# Patient Record
Sex: Female | Born: 1960 | Race: White | Hispanic: No | Marital: Married | Smoking: Never smoker
Health system: Southern US, Community
[De-identification: ages and names within clinical notes are randomized; demographics above are authoritative.]

## PROBLEM LIST (undated history)

## (undated) DIAGNOSIS — D649 Anemia, unspecified: Secondary | ICD-10-CM

## (undated) DIAGNOSIS — R51 Headache: Secondary | ICD-10-CM

## (undated) DIAGNOSIS — I10 Essential (primary) hypertension: Secondary | ICD-10-CM

## (undated) DIAGNOSIS — R000 Tachycardia, unspecified: Secondary | ICD-10-CM

## (undated) DIAGNOSIS — I498 Other specified cardiac arrhythmias: Secondary | ICD-10-CM

## (undated) DIAGNOSIS — R42 Dizziness and giddiness: Secondary | ICD-10-CM

## (undated) HISTORY — DX: Dizziness and giddiness: R42

## (undated) HISTORY — PX: NOSE SURGERY: SHX723

## (undated) HISTORY — PX: ENDOMETRIAL ABLATION: SHX621

## (undated) HISTORY — DX: Tachycardia, unspecified: R00.0

## (undated) HISTORY — PX: TONSILLECTOMY: SUR1361

## (undated) HISTORY — DX: Other specified cardiac arrhythmias: I49.8

## (undated) HISTORY — PX: APPENDECTOMY: SHX54

---

## 1998-12-19 ENCOUNTER — Ambulatory Visit (HOSPITAL_BASED_OUTPATIENT_CLINIC_OR_DEPARTMENT_OTHER): Admission: RE | Admit: 1998-12-19 | Discharge: 1998-12-19 | Payer: Self-pay | Admitting: Specialist

## 2000-07-24 ENCOUNTER — Encounter: Admission: RE | Admit: 2000-07-24 | Discharge: 2000-07-24 | Payer: Self-pay | Admitting: Specialist

## 2000-07-24 ENCOUNTER — Encounter: Payer: Self-pay | Admitting: Specialist

## 2000-09-22 ENCOUNTER — Emergency Department (HOSPITAL_COMMUNITY): Admission: EM | Admit: 2000-09-22 | Discharge: 2000-09-22 | Payer: Self-pay

## 2003-07-15 ENCOUNTER — Inpatient Hospital Stay (HOSPITAL_COMMUNITY): Admission: EM | Admit: 2003-07-15 | Discharge: 2003-07-17 | Payer: Self-pay | Admitting: *Deleted

## 2004-01-21 ENCOUNTER — Emergency Department (HOSPITAL_COMMUNITY): Admission: EM | Admit: 2004-01-21 | Discharge: 2004-01-21 | Payer: Self-pay | Admitting: Emergency Medicine

## 2011-02-27 ENCOUNTER — Other Ambulatory Visit: Payer: Self-pay | Admitting: Obstetrics and Gynecology

## 2011-03-02 MED ORDER — LACTATED RINGERS IV SOLN: INTRAVENOUS | Status: DC

## 2011-03-05 ENCOUNTER — Encounter (HOSPITAL_COMMUNITY)
Admission: RE | Admit: 2011-03-05 | Discharge: 2011-03-05 | Disposition: A | Discharge status: Home / Self Care | Payer: BC Managed Care – PPO | Source: Ambulatory Visit | Attending: Obstetrics and Gynecology | Admitting: Obstetrics and Gynecology

## 2011-03-05 ENCOUNTER — Encounter (HOSPITAL_COMMUNITY): Payer: Self-pay

## 2011-03-05 HISTORY — DX: Essential (primary) hypertension: I10

## 2011-03-05 HISTORY — DX: Headache: R51

## 2011-03-05 HISTORY — DX: Anemia, unspecified: D64.9

## 2011-03-05 LAB — CBC
HCT: 36.7 % (ref 36.0–46.0)
Hemoglobin: 11.8 g/dL — ABNORMAL LOW (ref 12.0–15.0)
MCV: 88.9 fL (ref 78.0–100.0)
RBC: 4.13 MIL/uL (ref 3.87–5.11)
WBC: 7.4 10*3/uL (ref 4.0–10.5)

## 2011-03-05 LAB — BASIC METABOLIC PANEL
CO2: 27 mEq/L (ref 19–32)
Chloride: 99 mEq/L (ref 96–112)
Creatinine, Ser: 0.65 mg/dL (ref 0.50–1.10)
GFR calc Af Amer: >60 mL/min (ref >60)
Potassium: 3.8 mEq/L (ref 3.5–5.1)

## 2011-03-05 NOTE — Anesthesia Preprocedure Evaluation (Addendum)
Anesthesia Evaluation  Name, MR# and DOB Patient awake  General Assessment Comment  Reviewed: Allergy & Precautions, H&P  and Patient's Chart, lab work & pertinent test results  Airway Mallampati: II TM Distance: >3 FB Neck ROM: Full    Dental No notable dental hx (+) Teeth Intact   Pulmonaryneg pulmonary ROS    clear to auscultation  pulmonary exam normal   Cardiovascular hypertension, Pt. on medications Regular Normal   Neuro/Psych (+) {AN ROS/MED HX NEURO HEADACHES (+) Anxiety, Negative Psych ROS  GI/Hepatic/Renal negative GI ROS, negative Liver ROS, and negative Renal ROS (+)       Endo/Other  Negative Endocrine ROS (+)   Abdominal   Musculoskeletal negative musculoskeletal ROS (+)  Hematology negative hematology ROS (+)   Peds  Reproductive/Obstetrics negative OB ROS   Anesthesia Other Findings             Anesthesia Physical Anesthesia Plan  ASA: II  Anesthesia Plan: General   Post-op Pain Management:    Induction: Intravenous  Airway Management Planned: LMA  Additional Equipment:   Intra-op Plan:   Post-operative Plan:   Informed Consent: I have reviewed the patients History and Physical, chart, labs and discussed the procedure including the risks, benefits and alternatives for the proposed anesthesia with the patient or authorized representative who has indicated his/her understanding and acceptance.     Plan Discussed with: Anesthesiologist (AP)  Anesthesia Plan Comments:         Anesthesia Quick Evaluation

## 2011-03-05 NOTE — Patient Instructions (Signed)
20 Kristen Mann  03/05/2011   Your procedure is scheduled on:  03/08/11   Report to [facility] at 0600 AM. Main entrance- desk phone x [phone]  Call this number if you have problems the morning of surgery: [phone]   Remember:   Do not eat food:After Midnight.  Do not drink clear liquids: After Midnight.  Take these medicines the morning of surgery with A SIP OF WATER: BP pill      Do not wear jewelry, make-up or nail polish.  Do not bring valuables to the hospital.  Contacts, dentures or bridgework may not be worn into surgery.  Leave suitcase in the car. After surgery it may be brought to your room.  For patients admitted to the hospital, checkout time is 11:00 AM the day of discharge.   Patients discharged the day of surgery will not be allowed to drive home.  Name and phone number of your driver: Britta Mccreedy [phone]  Special Instructions: N/A   Please read over the following fact sheets that you were given:

## 2011-03-07 NOTE — H&P (Addendum)
Kristen Mann is an 50 y.o. female. Who is coming in for a scheduled hysteroscopy novasure ablation given ongoing issues with menorrhagia and Abnormal bleeding.  SHe has attempted therapy with aygestin with some relief, but still has daily spotting and does not wish to take hormones long term. Endometrial biopsy was negative prior to surgery.  Pertinent Gynecological History: Menses: irregular occurring approximately every 30 days with spotting approximately 14 days per month and cycles lasting up to 14 days Bleeding: intermenstrual bleeding Contraception: vasectomy  H/o ASCUS pap/HPV +  2011--resolved Last pap: normal Date: 02/2011   Menstrual History:  No LMP recorded.    Past Medical History  Diagnosis Date  . Hypertension   . Anemia   . Headache   . Anxiety     Past Surgical History  Procedure Date  . Appendectomy     2003  . Nose surgery     No family history on file.  Social History:  reports that she has never smoked. She does not have any smokeless tobacco history on file. She reports that she drinks alcohol. Her drug history not on file.   She has a boyfriend of 5 years who has had a vasectomy.  Allergies:  Allergies  Allergen Reactions  . Codeine     Patient states that the medication makes her act crazy.  Very emotional.  . Sulfa Antibiotics Itching    No prescriptions prior to admission    Review of Systems  Constitutional: Positive for malaise/fatigue.    There were no vitals taken for this visit. Physical Exam  Constitutional: She appears well-developed and well-nourished.  Cardiovascular: Normal rate and regular rhythm.   Respiratory: Effort normal and breath sounds normal.  GI: Soft. Bowel sounds are normal.  Genitourinary: Vagina normal and uterus normal.  Neurological: She is alert.  Psychiatric: [name]. Her behavior is normal.      Assessment/Plan: Pt is for a scheduled endometrial ablation/hysteroscopy.   Risks and benefits reviewed with pt in detail including risk of bleeding or uterine perforation. Pt understands and desires to proceed.  Will use cytotec prior to surgery to assist with dilation.  Oliver Pila 03/07/2011, 6:45 PM  Per pt, no changes in dictated H&P.

## 2011-03-08 ENCOUNTER — Encounter (HOSPITAL_COMMUNITY): Payer: Self-pay | Admitting: Anesthesiology

## 2011-03-08 ENCOUNTER — Ambulatory Visit (HOSPITAL_COMMUNITY): Payer: BC Managed Care – PPO | Admitting: Anesthesiology

## 2011-03-08 ENCOUNTER — Ambulatory Visit (HOSPITAL_COMMUNITY)
Admission: RE | Admit: 2011-03-08 | Discharge: 2011-03-08 | Disposition: A | Discharge status: Home / Self Care | Payer: BC Managed Care – PPO | Source: Ambulatory Visit | Attending: Obstetrics and Gynecology | Admitting: Obstetrics and Gynecology

## 2011-03-08 ENCOUNTER — Encounter (HOSPITAL_COMMUNITY)
Admission: RE | Disposition: A | Discharge status: Home / Self Care | Payer: Self-pay | Source: Ambulatory Visit | Attending: Obstetrics and Gynecology

## 2011-03-08 DIAGNOSIS — Z01818 Encounter for other preprocedural examination: Secondary | ICD-10-CM | POA: Insufficient documentation

## 2011-03-08 DIAGNOSIS — N920 Excessive and frequent menstruation with regular cycle: Secondary | ICD-10-CM | POA: Insufficient documentation

## 2011-03-08 DIAGNOSIS — Z01812 Encounter for preprocedural laboratory examination: Secondary | ICD-10-CM | POA: Insufficient documentation

## 2011-03-08 SURGERY — HYSTEROSCOPY WITH NOVASURE
Anesthesia: General | Site: Uterus | Wound class: Clean Contaminated

## 2011-03-08 MED ORDER — LIDOCAINE HCL 2 % IJ SOLN
INTRAMUSCULAR | Status: DC | PRN
  Administered 2011-03-08: 20 mL

## 2011-03-08 MED ORDER — MIDAZOLAM HCL 5 MG/5ML IJ SOLN
INTRAMUSCULAR | Status: DC | PRN
  Administered 2011-03-08: 2 mg via INTRAVENOUS

## 2011-03-08 MED ORDER — LIDOCAINE HCL (CARDIAC) 20 MG/ML IV SOLN
INTRAVENOUS | Status: AC
  Filled 2011-03-08: qty 5

## 2011-03-08 MED ORDER — LACTATED RINGERS IR SOLN
Status: DC | PRN
  Administered 2011-03-08: 3000 mL

## 2011-03-08 MED ORDER — HYDROCODONE-ACETAMINOPHEN 5-325 MG PO TABS
2.0000 | ORAL_TABLET | Freq: Once | ORAL | Status: AC
  Administered 2011-03-08: 2 via ORAL

## 2011-03-08 MED ORDER — FENTANYL CITRATE 0.05 MG/ML IJ SOLN
25.0000 ug | INTRAMUSCULAR | Status: DC | PRN
  Administered 2011-03-08 (×2): 50 ug via INTRAVENOUS

## 2011-03-08 MED ORDER — LIDOCAINE HCL (CARDIAC) 20 MG/ML IV SOLN
INTRAVENOUS | Status: DC | PRN
  Administered 2011-03-08: 60 mg via INTRAVENOUS

## 2011-03-08 MED ORDER — KETOROLAC TROMETHAMINE 30 MG/ML IJ SOLN
INTRAMUSCULAR | Status: DC | PRN
  Administered 2011-03-08: 30 mg via INTRAVENOUS

## 2011-03-08 MED ORDER — LACTATED RINGERS IV SOLN
INTRAVENOUS | Status: DC
  Administered 2011-03-08 (×3): via INTRAVENOUS

## 2011-03-08 MED ORDER — FENTANYL CITRATE 0.05 MG/ML IJ SOLN
INTRAMUSCULAR | Status: AC
  Filled 2011-03-08: qty 5

## 2011-03-08 MED ORDER — MIDAZOLAM HCL 2 MG/2ML IJ SOLN
INTRAMUSCULAR | Status: AC
  Filled 2011-03-08: qty 2

## 2011-03-08 MED ORDER — ONDANSETRON HCL 4 MG/2ML IJ SOLN
INTRAMUSCULAR | Status: AC
  Filled 2011-03-08: qty 2

## 2011-03-08 MED ORDER — MEPERIDINE HCL 25 MG/ML IJ SOLN
INTRAMUSCULAR | Status: AC
  Administered 2011-03-08: 12.5 mg via INTRAVENOUS
  Filled 2011-03-08: qty 1

## 2011-03-08 MED ORDER — FENTANYL CITRATE 0.05 MG/ML IJ SOLN
INTRAMUSCULAR | Status: AC
  Administered 2011-03-08: 50 ug via INTRAVENOUS
  Filled 2011-03-08: qty 2

## 2011-03-08 MED ORDER — KETOROLAC TROMETHAMINE 30 MG/ML IJ SOLN
INTRAMUSCULAR | Status: AC
  Filled 2011-03-08: qty 1

## 2011-03-08 MED ORDER — PROPOFOL 10 MG/ML IV EMUL
INTRAVENOUS | Status: AC
  Filled 2011-03-08: qty 20

## 2011-03-08 MED ORDER — DEXAMETHASONE SODIUM PHOSPHATE 10 MG/ML IJ SOLN
INTRAMUSCULAR | Status: AC
  Filled 2011-03-08: qty 1

## 2011-03-08 MED ORDER — DEXAMETHASONE SODIUM PHOSPHATE 4 MG/ML IJ SOLN
INTRAMUSCULAR | Status: DC | PRN
  Administered 2011-03-08: 8 mg via INTRAVENOUS

## 2011-03-08 MED ORDER — HYDROCODONE-ACETAMINOPHEN 5-325 MG PO TABS
ORAL_TABLET | ORAL | Status: AC
  Filled 2011-03-08: qty 1

## 2011-03-08 MED ORDER — PROPOFOL 10 MG/ML IV EMUL
INTRAVENOUS | Status: DC | PRN
  Administered 2011-03-08: 180 mg via INTRAVENOUS

## 2011-03-08 MED ORDER — MEPERIDINE HCL 25 MG/ML IJ SOLN
6.2500 mg | INTRAMUSCULAR | Status: DC | PRN
  Administered 2011-03-08: 12.5 mg via INTRAVENOUS

## 2011-03-08 MED ORDER — FENTANYL CITRATE 0.05 MG/ML IJ SOLN
INTRAMUSCULAR | Status: DC | PRN
  Administered 2011-03-08: 100 ug via INTRAVENOUS

## 2011-03-08 MED ORDER — ONDANSETRON HCL 4 MG/2ML IJ SOLN
INTRAMUSCULAR | Status: DC | PRN
  Administered 2011-03-08: 4 mg via INTRAVENOUS

## 2011-03-08 SURGICAL SUPPLY — 17 items
ABLATOR ENDOMETRIAL BIPOLAR (ABLATOR) ×3 IMPLANT
CANISTER SUCTION 2500CC (MISCELLANEOUS) ×3 IMPLANT
CATH ROBINSON RED A/P 16FR (CATHETERS) ×3 IMPLANT
CLOTH BEACON ORANGE TIMEOUT ST (SAFETY) ×3 IMPLANT
CONTAINER PREFILL 10% NBF 60ML (FORM) ×6 IMPLANT
DRAPE BUTTOCK UNDER FLUID (DRAPE) ×3 IMPLANT
DRAPE UTILITY XL STRL (DRAPES) ×3 IMPLANT
ELECT REM PT RETURN 9FT ADLT (ELECTROSURGICAL)
ELECTRODE REM PT RTRN 9FT ADLT (ELECTROSURGICAL) IMPLANT
GLOVE BIO SURGEON STRL SZ8 (GLOVE) ×3 IMPLANT
GLOVE ORTHO TXT STRL SZ7.5 (GLOVE) ×3 IMPLANT
GOWN PREVENTION PLUS LG XLONG (DISPOSABLE) ×3 IMPLANT
GOWN PREVENTION PLUS XLARGE (GOWN DISPOSABLE) ×3 IMPLANT
LOOP ANGLED CUTTING 22FR (CUTTING LOOP) IMPLANT
PACK HYSTEROSCOPY LF (CUSTOM PROCEDURE TRAY) ×3 IMPLANT
SLEEVE SCD COMPRESS KNEE MED (MISCELLANEOUS) ×3 IMPLANT
TOWEL OR 17X24 6PK STRL BLUE (TOWEL DISPOSABLE) ×6 IMPLANT

## 2011-03-08 NOTE — Anesthesia Postprocedure Evaluation (Signed)
  Anesthesia Post-op Note  Patient: Kristen Mann  Procedure(s) Performed:  HYSTEROSCOPY WITH NOVASURE  Patient Location: PACU  Anesthesia Type: General  Level of Consciousness: awake, alert  and oriented  Airway and Oxygen Therapy: Patient Spontanous Breathing  Post-op Pain: none  Post-op Assessment: Post-op Vital signs reviewed, Patient's Cardiovascular Status Stable, Respiratory Function Stable, Patent Airway, No signs of Nausea or vomiting and Pain level controlled  Post-op Vital Signs: Reviewed and stable  Complications: No apparent anesthesia complications

## 2011-03-08 NOTE — Transfer of Care (Signed)
Immediate Anesthesia Transfer of Care Note  Patient: Kristen Mann  Procedure(s) Performed:  HYSTEROSCOPY WITH NOVASURE  Patient Location: PACU  Anesthesia Type: General  Level of Consciousness: awake, alert  and oriented  Airway & Oxygen Therapy: Patient Spontanous Breathing and Patient connected to nasal cannula oxygen  Post-op Assessment: Report given to PACU RN  Post vital signs: Reviewed and stable  Complications: No apparent anesthesia complications

## 2011-03-08 NOTE — Op Note (Addendum)
Operative note    Preop diagnosis    menorrhagia and abnormal uterine bleeding  Postop diagnosis   same with normal uterine cavity  Procedure  hysteroscopy and NovaSure ablation  Surgeon Dr. Huel Cote  Anesthesia LMA and local paracervical block with 2% lidocaine  Findings   the uterine cavity sounded 9 cm and the cervical length was 3.5 cm for a cavity length of 5.5 and a cavity width was 4.5. The total treatment time was 1 minute and 38 seconds at a power of 136. The cavity itself was normal with no polyps or fibroids noted.  Fluids estimated blood loss minimal IV fluids approximately 1 L of lactated Ringer's Urine output approximately 50 cc straight catheter prior to procedure Hysteroscopic deficit with at , however easily 100 was on the floor  Procedure Patient was taken to the operating room after informed consent was obtained. An appropriate time out was performed and the patient was prepped and draped in the normal sterile fashion in the dorsal lithotomy position. A speculum was placed within the vagina and the cervix identified and injected with approximately 2 cc of 2% lidocaine on the anterior lip. This was then grasped with a single-tooth tenaculum and an additional 8 cc each was placed at 2 and 10:00 for a paracervical block. The uterus was then easily sounded to 9 cm and the cervix measured at 3.5 cm with the Hegar dilator. The cervix was then found to be dilated from the Cytotec use and the [facility] dilators easily passed up to size 23. At this point the diagnostic hysteroscope was introduced into the uterus and the cavity inspected and found to be normal. Both tubal ostia were visible and there is no fibroids or polyps noted. The hysteroscope was then removed and the NovaSure device placed within the cavity. It was easily deployed and a cavity width of 4.5 noted. The cervical seal was then placed and a cavity check test performed and passed. The device was then  activated and a treatment time of 1 minute and 38 seconds noted. At the conclusion of treatment the device was removed and the hysteroscope reintroduced into the cavity. The entire cavity appeared well treated it with blanching noted and no viable endometrium seen even in the cornua. At this point the procedure was concluded and the hysteroscope removed. The tenaculum was removed and the tenaculum site treated with silver nitrate for a small area of bleeding. All instruments and sponge counts were correct and the patient was taken to the recovery room in good condition.

## 2011-07-29 ENCOUNTER — Encounter (HOSPITAL_COMMUNITY): Payer: Self-pay | Admitting: *Deleted

## 2011-07-29 ENCOUNTER — Emergency Department (HOSPITAL_COMMUNITY): Payer: BC Managed Care – PPO

## 2011-07-29 ENCOUNTER — Other Ambulatory Visit: Payer: Self-pay

## 2011-07-29 ENCOUNTER — Emergency Department (HOSPITAL_COMMUNITY)
Admission: EM | Admit: 2011-07-29 | Discharge: 2011-07-29 | Disposition: A | Discharge status: Home / Self Care | Payer: BC Managed Care – PPO | Attending: Emergency Medicine | Admitting: Emergency Medicine

## 2011-07-29 DIAGNOSIS — R0602 Shortness of breath: Secondary | ICD-10-CM | POA: Insufficient documentation

## 2011-07-29 DIAGNOSIS — G478 Other sleep disorders: Secondary | ICD-10-CM | POA: Insufficient documentation

## 2011-07-29 DIAGNOSIS — F0390 Unspecified dementia without behavioral disturbance: Secondary | ICD-10-CM | POA: Insufficient documentation

## 2011-07-29 DIAGNOSIS — R079 Chest pain, unspecified: Secondary | ICD-10-CM | POA: Insufficient documentation

## 2011-07-29 DIAGNOSIS — Z79899 Other long term (current) drug therapy: Secondary | ICD-10-CM | POA: Insufficient documentation

## 2011-07-29 DIAGNOSIS — I2510 Atherosclerotic heart disease of native coronary artery without angina pectoris: Secondary | ICD-10-CM | POA: Insufficient documentation

## 2011-07-29 DIAGNOSIS — I10 Essential (primary) hypertension: Secondary | ICD-10-CM | POA: Insufficient documentation

## 2011-07-29 DIAGNOSIS — F411 Generalized anxiety disorder: Secondary | ICD-10-CM | POA: Insufficient documentation

## 2011-07-29 LAB — TROPONIN I: Troponin I: <0.3 ng/mL (ref <0.30)

## 2011-07-29 LAB — POCT I-STAT TROPONIN I

## 2011-07-29 LAB — D-DIMER, QUANTITATIVE: D-Dimer, Quant: <0.22 ug/mL-FEU (ref 0.00–0.48)

## 2011-07-29 LAB — CBC
MCHC: 34.4 g/dL (ref 30.0–36.0)
RDW: 13.2 % (ref 11.5–15.5)

## 2011-07-29 LAB — BASIC METABOLIC PANEL
BUN: 13 mg/dL (ref 6–23)
GFR calc Af Amer: >90 mL/min (ref >90)
GFR calc non Af Amer: >90 mL/min (ref >90)
Potassium: 3.6 mEq/L (ref 3.5–5.1)
Sodium: 135 mEq/L (ref 135–145)

## 2011-07-29 MED ORDER — ZOLPIDEM TARTRATE 5 MG PO TABS: 5.0000 mg | ORAL_TABLET | Freq: Every evening | ORAL | Status: DC | PRN

## 2011-07-29 MED ORDER — ASPIRIN 81 MG PO CHEW
324.0000 mg | CHEWABLE_TABLET | Freq: Once | ORAL | Status: DC
  Filled 2011-07-29: qty 1

## 2011-07-29 MED ORDER — OMEPRAZOLE 20 MG PO CPDR: 40.0000 mg | DELAYED_RELEASE_CAPSULE | Freq: Every day | ORAL | Status: DC

## 2011-07-29 NOTE — ED Provider Notes (Signed)
History     CSN: [id number]  Arrival date & time 07/29/11  0445   First MD Initiated Contact with Patient 07/29/11 [phone]      Chief Complaint  Patient presents with  . Hypertension  . Chest Pain    pressure, tightness  . Shortness of Breath    (Consider location/radiation/quality/duration/timing/severity/associated sxs/prior treatment) Patient is a 50 y.o. female presenting with hypertension, chest pain, and shortness of breath. The history is provided by the patient.  Hypertension This is a new problem. The current episode started 6 to 12 hours ago. The problem occurs constantly. Associated symptoms include chest pain and shortness of breath.  Chest Pain The severity of the pain is moderate. The quality of the pain is described as tightness and burning. The pain radiates to the left arm. Primary symptoms include shortness of breath. She tried nothing for the symptoms.  Her past medical history is significant for CAD and hypertension.  Pertinent negatives for past medical history include no MI and no PE.  Pertinent negatives for family medical history include: no CAD in family.    Shortness of Breath  Associated symptoms include chest pain and shortness of breath.   patient states the symptoms started when she was woken from sleep. Patient felt like her heart was pounding out of her chest and her heart was racing. During this time she did have the trouble breathing in the discomfort in her chest. Patient had noted that her blood pressure was elevated approximately 160/100. She denies any history of heart disease. She has no history of pulmonary embolism. The symptoms seem to be slowly getting better.  Past Medical History  Diagnosis Date  . Hypertension   . Anemia   . Headache   . Anxiety     Past Surgical History  Procedure Date  . Appendectomy     2003  . Nose surgery     History reviewed. No pertinent family history.  History  Substance Use Topics  . Smoking  status: Never Smoker   . Smokeless tobacco: Not on file  . Alcohol Use: Yes     occasionally    OB History    Grav Para Term Preterm Abortions TAB SAB Ect Mult Living                  Review of Systems  Respiratory: Positive for shortness of breath.   Cardiovascular: Positive for chest pain.  All other systems reviewed and are negative.    Allergies  Codeine and Sulfa antibiotics  Home Medications   Current Outpatient Rx  Name Route Sig Dispense Refill  . LOSARTAN POTASSIUM-HCTZ 100-25 MG PO TABS Oral Take 0.5 tablets by mouth daily.      Marland Kitchen BUTALBITAL-APAP-CAFF-COD 50-325-40-30 MG PO CAPS Oral Take 1 capsule by mouth every 4 (four) hours as needed. For headache     . CETIRIZINE HCL 10 MG PO TABS Oral Take 10 mg by mouth daily.        BP 136/68  Pulse 84  Temp(Src) 98.4 F (36.9 C) (Oral)  Resp 19  SpO2 100%  Physical Exam  Nursing note and vitals reviewed. Constitutional: She appears well-developed and well-nourished. No distress.  HENT:  Head: Normocephalic and atraumatic.  Right Ear: External ear normal.  Left Ear: External ear normal.  Eyes: Conjunctivae are normal. Right eye exhibits no discharge. Left eye exhibits no discharge. No scleral icterus.  Neck: Neck supple. No tracheal deviation present.  Cardiovascular: Normal rate, regular rhythm and  intact distal pulses.   Pulmonary/Chest: Effort normal and breath sounds normal. No stridor. No respiratory distress. She has no wheezes. She has no rales.  Abdominal: Soft. Bowel sounds are normal. She exhibits no distension. There is no tenderness. There is no rebound and no guarding.  Musculoskeletal: She exhibits no edema and no tenderness.  Neurological: She is alert. She has normal strength. No sensory deficit. Cranial nerve deficit:  no gross defecits noted. She exhibits normal muscle tone. She displays no seizure activity. Coordination normal.  Skin: Skin is warm and dry. No rash noted.  Psychiatric: She has a  normal mood and affect.    ED Course  Procedures (including critical care time)  Date: 07/29/2011  Rate: 84  Rhythm: normal sinus rhythm  QRS Axis: normal  Intervals: normal  ST/T Wave abnormalities: normal  Conduction Disutrbances:none  Narrative Interpretation: rsr in v1 and v2, consider prv or VCD  Old EKG Reviewed: none available   Labs Reviewed  BASIC METABOLIC PANEL - Abnormal; Notable for the following:    Glucose, Bld 106 (*)    All other components within normal limits  CBC  POCT I-STAT TROPONIN I  D-DIMER, QUANTITATIVE  I-STAT TROPONIN I  TROPONIN I   Dg Chest 2 View  07/29/2011  *RADIOLOGY REPORT*  Clinical Data: Chest and left shoulder pain  CHEST - 2 VIEW  Comparison: 01/21/2004  Findings: Lungs hyperinflated. Lungs clear.  Heart size and pulmonary vascularity normal.  No effusion.  Visualized bones unremarkable.  IMPRESSION: No acute disease  Original Report Authenticated By: Thora Lance III, M.D.     1. Chest pain       MDM  Patient was monitored in the emergency room for several hours. Her 2 sets of cardiac enzymes were normal and her EKG is reassuring. Patient does mention having trouble with sleeping and increased stress. Her symptoms were associated with palpitations. I suspect this may be related possibly to a panic attack however I certainly cannot exclude coronary artery disease. Patient is low risk and she can be followed closely as an outpatient. I've explained these findings to the patient and she agrees. She has been instructed her to return for any recurrent symptoms. She has dementia she has not been sleeping well and will give her prescription for a sleeping pill for the next couple days.        Celene Kras, MD 07/29/11 [phone]

## 2011-07-29 NOTE — ED Notes (Signed)
Patient is alert and oriented x3.  She has been given DC instructions with MD follow up visits.  Verbal confirmation was given by patient V/S stable.  Patient DC under own ambulatory power. Patient was not showing any signs of distress on DC  

## 2011-07-29 NOTE — ED Notes (Signed)
Pt states she was woken from sleep "felt like my heart was pounding out of my chest" "felt almost like fluttering". Pt checked her BP and it was elevated.

## 2011-08-13 ENCOUNTER — Other Ambulatory Visit: Payer: Self-pay | Admitting: Internal Medicine

## 2011-08-13 DIAGNOSIS — R0602 Shortness of breath: Secondary | ICD-10-CM

## 2011-08-13 DIAGNOSIS — I10 Essential (primary) hypertension: Secondary | ICD-10-CM

## 2011-08-14 ENCOUNTER — Ambulatory Visit
Admission: RE | Admit: 2011-08-14 | Discharge: 2011-08-14 | Disposition: A | Discharge status: Home / Self Care | Payer: BC Managed Care – PPO | Source: Ambulatory Visit | Attending: Internal Medicine | Admitting: Internal Medicine

## 2011-08-14 DIAGNOSIS — R0602 Shortness of breath: Secondary | ICD-10-CM

## 2011-08-14 DIAGNOSIS — I10 Essential (primary) hypertension: Secondary | ICD-10-CM

## 2011-08-14 MED ORDER — IOHEXOL 300 MG/ML  SOLN
100.0000 mL | Freq: Once | INTRAMUSCULAR | Status: AC | PRN
  Administered 2011-08-14: 100 mL via INTRAVENOUS

## 2012-03-26 ENCOUNTER — Other Ambulatory Visit: Payer: Self-pay

## 2012-08-16 IMAGING — CT CT ANGIO CHEST
1 of 6 series · 18 of 32 positions shown · IV contrast ([ID] OMNI 300)
Comparison: Chest x-ray of 07/29/2011

CLINICAL DATA: Upper chest pressure, cough, some shortness of
breath with dizziness for 2 weeks

CT ANGIOGRAPHY CHEST WITH CONTRAST
TECHNIQUE: Multidetector CT imaging of the chest was performed
using the standard protocol during bolus administration of
intravenous contrast.  Multiplanar CT image reconstructions
including MIPs were obtained to evaluate the vascular anatomy.
Contrast: 100mL OMNIPAQUE IOHEXOL 300 MG/ML IV SOLN

[Series 6: pe thin 1.25 · axial · 0.59mm/px · z∈[-282,-27]mm · 18 of 286 slices shown]
[im 16/286  lung]
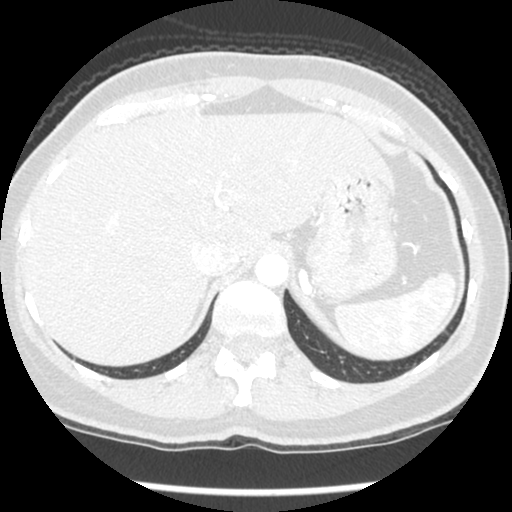
[im 31/286  mediastinal]
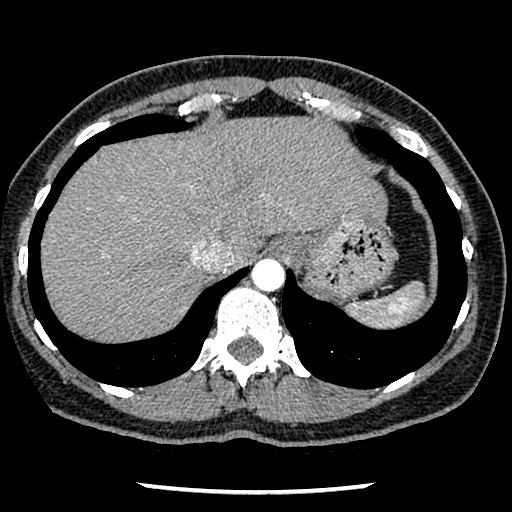
[im 46/286  lung]
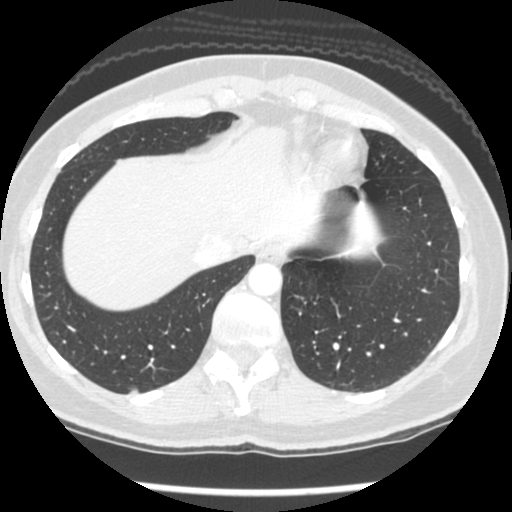
[im 61/286  mediastinal]
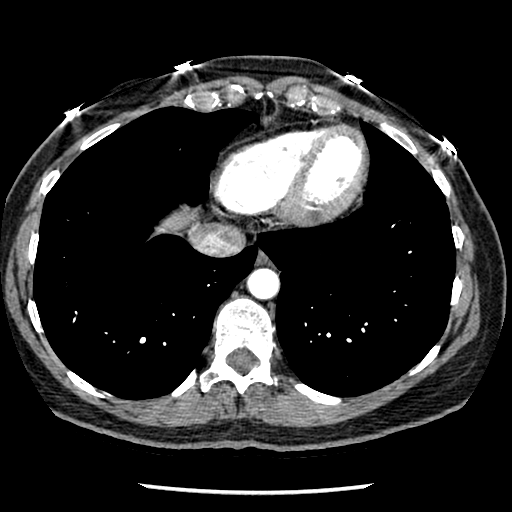
[im 76/286  lung]
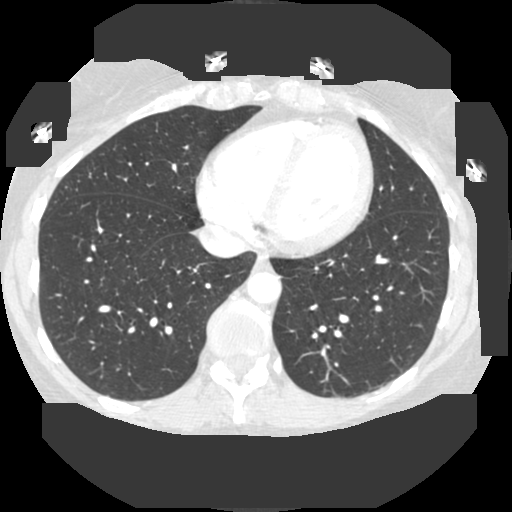
[im 91/286  mediastinal]
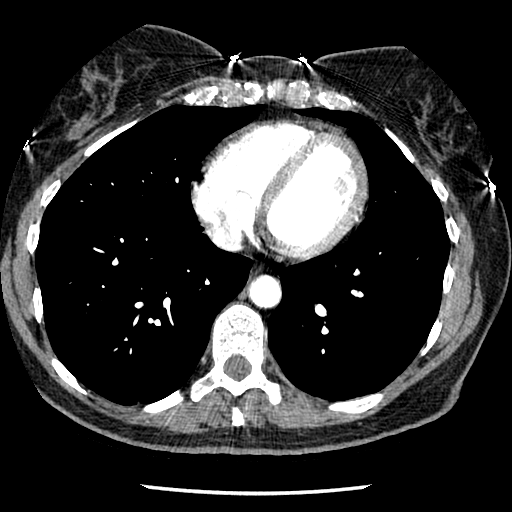
[im 106/286  lung]
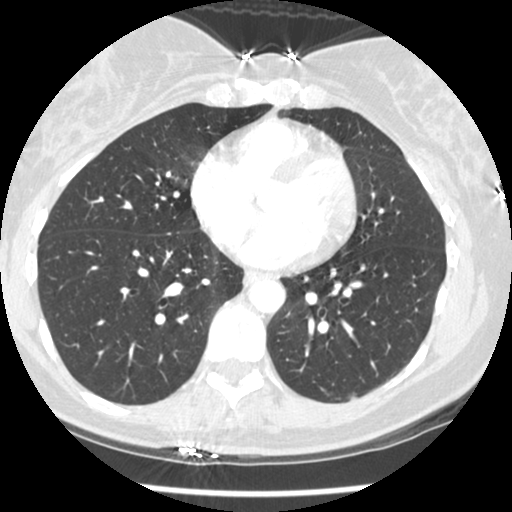
[im 121/286  mediastinal]
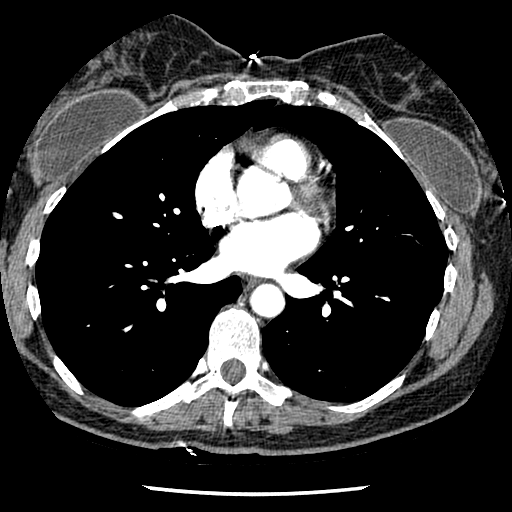
[im 136/286  lung]
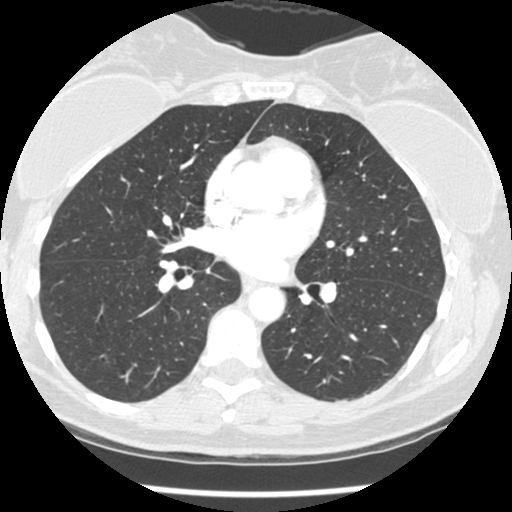
[im 151/286  mediastinal]
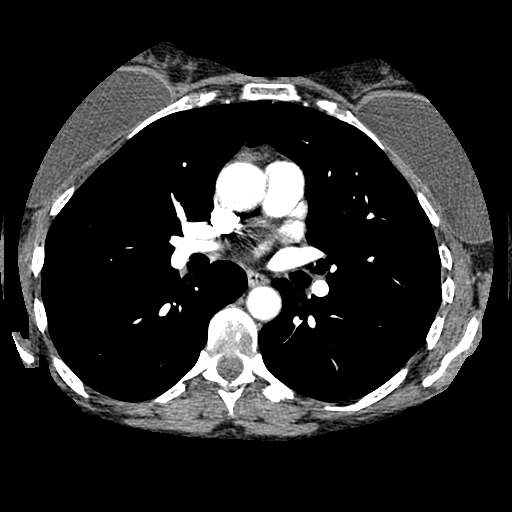
[im 166/286  lung]
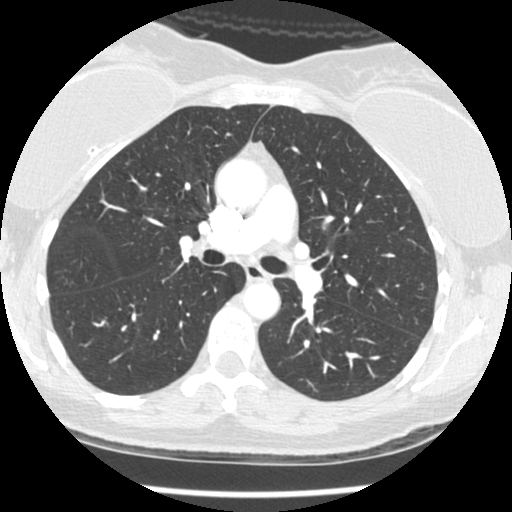
[im 181/286  mediastinal]
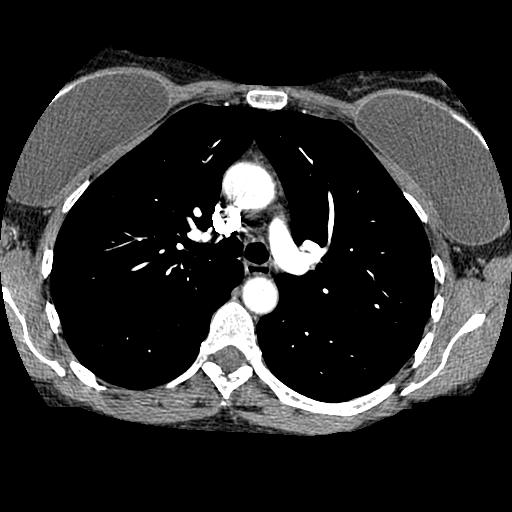
[im 196/286  lung]
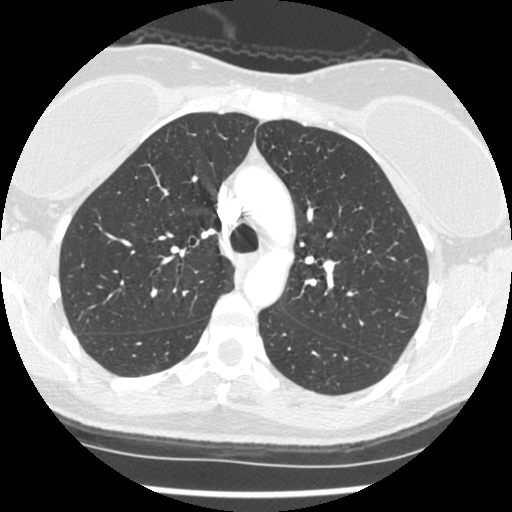
[im 211/286  mediastinal]
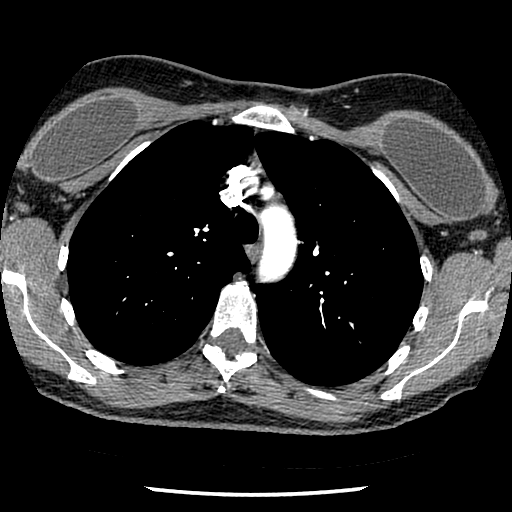
[im 226/286  lung]
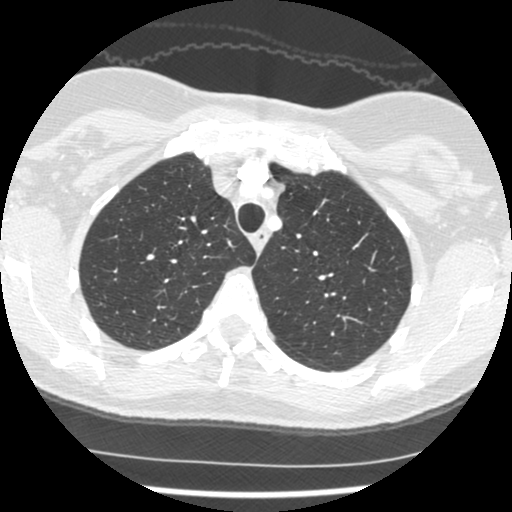
[im 241/286  mediastinal]
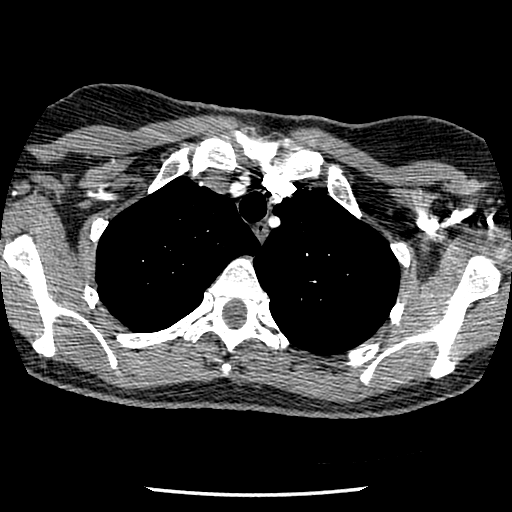
[im 256/286  lung]
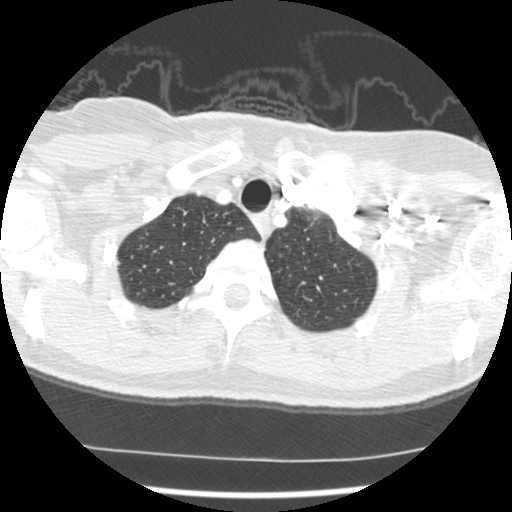
[im 271/286  mediastinal]
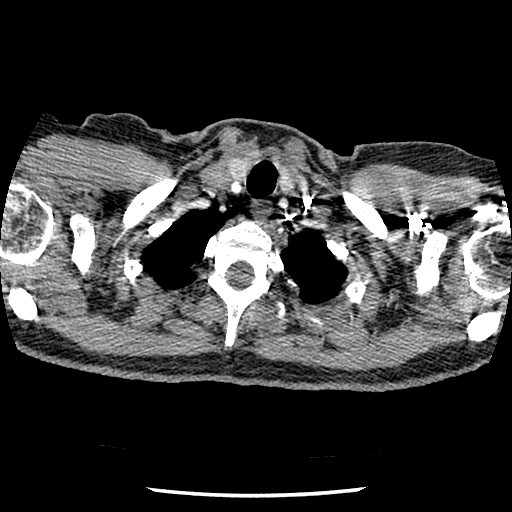

[18 of 32 positions shown; findings below may reference images not displayed]

FINDINGS: The pulmonary arteries opacify well with no evidence of
acute pulmonary embolism.  The thoracic aorta also opacifies with
no acute abnormality.  The origins of the great vessels are patent.
No mediastinal or hilar adenopathy is seen.  Bilateral breast
implants are noted.  The thyroid gland is normal in size.

On the lung window images, no parenchymal infiltrate is seen.
There is no evidence of suspicious lung nodule.  No pleural
effusion is seen.  No bony abnormality is seen.

Review of the MIP images confirms the above findings.
IMPRESSION: Negative CT angiogram of the chest.

## 2012-08-29 ENCOUNTER — Other Ambulatory Visit: Payer: Self-pay | Admitting: Orthopedic Surgery

## 2013-01-19 ENCOUNTER — Other Ambulatory Visit: Payer: Self-pay | Admitting: Gastroenterology

## 2014-05-07 ENCOUNTER — Other Ambulatory Visit: Payer: Self-pay | Admitting: Obstetrics and Gynecology

## 2014-05-07 DIAGNOSIS — R928 Other abnormal and inconclusive findings on diagnostic imaging of breast: Secondary | ICD-10-CM

## 2014-05-17 ENCOUNTER — Ambulatory Visit
Admission: RE | Admit: 2014-05-17 | Discharge: 2014-05-17 | Disposition: A | Discharge status: Home / Self Care | Payer: Commercial Indemnity | Source: Ambulatory Visit | Attending: Obstetrics and Gynecology | Admitting: Obstetrics and Gynecology

## 2014-05-17 ENCOUNTER — Encounter (INDEPENDENT_AMBULATORY_CARE_PROVIDER_SITE_OTHER): Payer: Self-pay

## 2014-05-17 DIAGNOSIS — R928 Other abnormal and inconclusive findings on diagnostic imaging of breast: Secondary | ICD-10-CM

## 2015-01-26 ENCOUNTER — Other Ambulatory Visit: Payer: Self-pay | Admitting: Plastic Surgery

## 2015-03-16 ENCOUNTER — Other Ambulatory Visit: Payer: Self-pay | Admitting: Orthopedic Surgery

## 2015-03-29 ENCOUNTER — Emergency Department (INDEPENDENT_AMBULATORY_CARE_PROVIDER_SITE_OTHER)
Admission: EM | Admit: 2015-03-29 | Discharge: 2015-03-29 | Disposition: A | Discharge status: Home / Self Care | Payer: Managed Care, Other (non HMO) | Source: Home / Self Care | Attending: Emergency Medicine | Admitting: Emergency Medicine

## 2015-03-29 ENCOUNTER — Encounter (HOSPITAL_COMMUNITY): Payer: Self-pay | Admitting: Emergency Medicine

## 2015-03-29 DIAGNOSIS — T63441A Toxic effect of venom of bees, accidental (unintentional), initial encounter: Secondary | ICD-10-CM

## 2015-03-29 DIAGNOSIS — IMO0001 Reserved for inherently not codable concepts without codable children: Secondary | ICD-10-CM

## 2015-03-29 MED ORDER — DEXAMETHASONE SODIUM PHOSPHATE 10 MG/ML IJ SOLN
INTRAMUSCULAR | Status: AC
  Filled 2015-03-29: qty 1

## 2015-03-29 MED ORDER — PREDNISONE 20 MG PO TABS: ORAL_TABLET | ORAL | Status: DC

## 2015-03-29 MED ORDER — DIPHENHYDRAMINE HCL 50 MG/ML IJ SOLN
INTRAMUSCULAR | Status: AC
  Filled 2015-03-29: qty 1

## 2015-03-29 MED ORDER — TRIAMCINOLONE ACETONIDE 0.1 % EX CREA: 1.0000 "application " | TOPICAL_CREAM | Freq: Two times a day (BID) | CUTANEOUS | Status: DC

## 2015-03-29 MED ORDER — DEXAMETHASONE SODIUM PHOSPHATE 10 MG/ML IJ SOLN
10.0000 mg | Freq: Once | INTRAMUSCULAR | Status: AC
  Administered 2015-03-29: 10 mg via INTRAMUSCULAR

## 2015-03-29 MED ORDER — DIPHENHYDRAMINE HCL 50 MG/ML IJ SOLN
50.0000 mg | Freq: Once | INTRAMUSCULAR | Status: AC
  Administered 2015-03-29: 50 mg via INTRAMUSCULAR

## 2015-03-29 NOTE — ED Notes (Signed)
Pt believes she was stung by a wasp or bee yesterday on the back of her RUA.  Since that time she has developed a large red band around the entire upper arm, she has a knot on her right elbow that appears to have fluid in it and she has small blisters along the upper arm.  She states she can feel the swelling moving.  She does not have any trouble breathing or swallowing but she feels it is moving in that direction.  She has been instructed to let us know if this changes at all.

## 2015-03-29 NOTE — ED Provider Notes (Signed)
CSN: [id number]     Arrival date & time 03/29/15  1318 History   First MD Initiated Contact with Patient 03/29/15 1420     Chief Complaint  Patient presents with  . Insect Bite   (Consider location/radiation/quality/duration/timing/severity/associated sxs/prior Treatment) HPI Comments: 54 year old female states that she was stung by what she believes to be a wasp yesterday. The sting occurred to the right upper arm in the tricep area. In a short period of time she developed a band of erythema around her right upper arm that felt hot and itchy. Through the ensuing hours the erythema involved approximate 80% of her upper arm. She she has been in the urgent care she feels it swelling and redness has extended distally beyond the elbow. Denies systemic symptoms. Denies swelling in other areas. Denies swelling of the tongue or throat. Denies trouble breathing. She has been applying ice which has helped and took a Benadryl last night which also seemed to help some with her symptoms.  Patient is a 54 y.o. female presenting with trauma and allergic reaction.  Trauma   Current symptoms:      Associated symptoms:            Reports headache.            Denies chest pain, difficulty breathing and seizures.  Allergic Reaction Presenting symptoms: itching, rash and swelling   Presenting symptoms: no difficulty breathing, no difficulty swallowing and no wheezing   Severity:  Mild Prior allergic episodes:  Insect allergies Context: insect bite/sting   Context: no animal exposure, no medications, no new detergents/soaps and no poison ivy   Relieved by:  Antihistamines and cold compresses   Past Medical History  Diagnosis Date  . Hypertension   . Anemia   . Headache(784.0)   . Anxiety    Past Surgical History  Procedure Laterality Date  . Appendectomy      2003  . Nose surgery     History reviewed. No pertinent family history. Social History  Substance Use Topics  . Smoking status: Never  Smoker   . Smokeless tobacco: None  . Alcohol Use: Yes     Comment: occasionally   OB History    No data available     Review of Systems  Constitutional: Negative for fever, chills and activity change.  HENT: Negative for congestion, ear discharge, ear pain, postnasal drip, rhinorrhea, sinus pressure, sore throat, trouble swallowing and voice change.   Eyes: Negative.   Respiratory: Negative for cough, choking, chest tightness, shortness of breath, wheezing and stridor.   Cardiovascular: Negative for chest pain and leg swelling.  Gastrointestinal: Negative.   Musculoskeletal: Negative.   Skin: Positive for itching and rash.  Neurological: Positive for headaches. Negative for dizziness, tremors, seizures and syncope.    Allergies  Codeine and Sulfa antibiotics  Home Medications   Prior to Admission medications   Medication Sig Start Date End Date Taking? Authorizing Provider  butalbital-acetaminophen-caffeine (FIORICET WITH CODEINE) 50-325-40-30 MG per capsule Take 1 capsule by mouth every 4 (four) hours as needed. For headache     Historical Provider, MD  cetirizine (ZYRTEC) 10 MG tablet Take 10 mg by mouth daily.      Historical Provider, MD  losartan-hydrochlorothiazide (HYZAAR) 100-25 MG per tablet Take 0.5 tablets by mouth daily.      Historical Provider, MD  omeprazole (PRILOSEC) 20 MG capsule Take 2 capsules (40 mg total) by mouth daily. 07/29/11 07/28/12  Dorie Rank, MD  predniSONE (DELTASONE) 20  MG tablet Take 3 tabs po on first day, 2 tabs second day, 2 tabs third day, 1 tab fourth day, 1 tab 5th day. Take with food. 03/29/15   Janne Napoleon, NP  triamcinolone cream (KENALOG) 0.1 % Apply 1 application topically 2 (two) times daily. 03/29/15   Janne Napoleon, NP  zolpidem (AMBIEN) 5 MG tablet Take 1 tablet (5 mg total) by mouth at bedtime as needed for sleep. 07/29/11 07/28/12  Dorie Rank, MD   BP 151/95 mmHg  Pulse 78  Temp(Src) 98.5 F (36.9 C) (Oral)  Resp 16  SpO2  100% Physical Exam  Constitutional: [name], place, and time. No distress.  HENT:  Mouth/Throat: Oropharynx is clear and moist. No oropharyngeal exudate.  No intraoral swelling, edema or discoloration. Soft palate rises symmetrically and is of normal size and shape. Normal tongue movement. No stridor or other abnormal airway sounds. Voice is clear and normal for her.  Eyes: Conjunctivae and EOM are normal.  Neck: Normal range of motion. Neck supple.  Cardiovascular: Normal rate, regular rhythm, normal heart sounds and intact distal pulses.   No murmur heard. Pulmonary/Chest: Effort normal and breath sounds normal. No respiratory distress. She has no wheezes. She has no rales.  Musculoskeletal: She exhibits edema and tenderness.  Right upper arm with swelling and erythema extending just distally to the elbow. [name] to the right upper outer arm. Distal neurovascular motor Sentry is intact.  Lymphadenopathy:    She has no cervical adenopathy.  Neurological: She is alert and oriented to person, place, and time. She exhibits normal muscle tone.  Skin: Skin is warm and dry. There is erythema.  Psychiatric: [name].  Nursing note and vitals reviewed.   ED Course  Procedures (including critical care time) Labs Review Labs Reviewed - No data to display  Imaging Review No results found.   MDM   1. Hymenoptera reaction, accidental or unintentional, initial encounter    1612h; a few minutes after receiving the Benadryl 50 mg IM and Decadron 10 mg IM patient states that she felt like there was something in her throat was having trouble swallowing. She was reexamined. The oropharynx is widely patent there is no evidence of swelling or erythema. No change from the initial exam. Her lungs are perfectly clear. In no respiratory distress, no wheezing or other abnormal upper or lower respiratory  sounds.  Prednisone taper start tomorrow Triamcinolone cream twice a day Ice locally Benadryl every 4 to 6 hours as needed Patient states she is feeling generally well home discharge. No new symptoms. She is requesting to go home.  Janne Napoleon, NP 03/29/15 1710  Janne Napoleon, NP 03/29/15 [phone]

## 2015-03-29 NOTE — Discharge Instructions (Signed)
Bee, Wasp, or Hornet Sting °Prednisone taper start tomorrow °Triamcinolone cream twice a day °Ice locally °Benadryl every 4 to 6 hours as needed °Your caregiver has diagnosed you as having an insect sting. An insect sting appears as a red lump in the skin that sometimes has a tiny hole in the center, or it may have a stinger in the center of the wound. The most common stings are from wasps, hornets and bees. °Individuals have different reactions to insect stings. °· A normal reaction may cause pain, swelling, and redness around the sting site. °· A localized allergic reaction may cause swelling and redness that extends beyond the sting site. °· A large local reaction may continue to develop over the next 12 to 36 hours. °· On occasion, the reactions can be severe (anaphylactic reaction). An anaphylactic reaction may cause wheezing; difficulty breathing; chest pain; fainting; raised, itchy, red patches on the skin; a sick feeling to your stomach (nausea); vomiting; cramping; or diarrhea. If you have had an anaphylactic reaction to an insect sting in the past, you are more likely to have one again. °HOME CARE INSTRUCTIONS  °· With bee stings, a small sac of poison is left in the wound. Brushing across this with something such as a credit card, or anything similar, will help remove this and decrease the amount of the reaction. This same procedure will not help a wasp sting as they do not leave behind a stinger and poison sac. °· Apply a cold compress for 10 to 20 minutes every hour for 1 to 2 days, depending on severity, to reduce swelling and itching. °· To lessen pain, a paste made of water and baking soda may be rubbed on the bite or sting and left on for 5 minutes. °· To relieve itching and swelling, you may use take medication or apply medicated creams or lotions as directed. °· Only take over-the-counter or prescription medicines for pain, discomfort, or fever as directed by your caregiver. °· Wash the sting site  daily with soap and water. Apply antibiotic ointment on the sting site as directed. °· If you suffered a severe reaction: °¨ If you did not require hospitalization, an adult will need to stay with you for 24 hours in case the symptoms return. °¨ You may need to wear a medical bracelet or necklace stating the allergy. °¨ You and your family need to learn when and how to use an anaphylaxis kit or epinephrine injection. °¨ If you have had a severe reaction before, always carry your anaphylaxis kit with you. °SEEK MEDICAL CARE IF:  °· None of the above helps within 2 to 3 days. °· The area becomes red, warm, tender, and swollen beyond the area of the bite or sting. °· You have an oral temperature above 102° F (38.9° C). °SEEK IMMEDIATE MEDICAL CARE IF:  °You have symptoms of an allergic reaction which are: °· Wheezing. °· Difficulty breathing. °· Chest pain. °· Lightheadedness or fainting. °· Itchy, raised, red patches on the skin. °· Nausea, vomiting, cramping or diarrhea. °ANY OF THESE SYMPTOMS MAY REPRESENT A SERIOUS PROBLEM THAT IS AN EMERGENCY. Do not wait to see if the symptoms will go away. Get medical help right away. Call your local emergency services (911 in [place] DO NOT drive yourself to the hospital. °MAKE SURE YOU:  °· Understand these instructions. °· Will watch your condition. °· Will get help right away if you are not doing well or get worse. °Document Released: 07/23/2005 Document Revised:   10/15/2011 Document Reviewed: 01/07/2010 °ExitCare® Patient Information ©2015 [employer]. This information is not intended to replace advice given to you by your health care provider. Make sure you discuss any questions you have with your health care provider. ° °

## 2015-03-29 NOTE — ED Notes (Signed)
Pt complains of some difficulty breathing and swallowing.  Pt sat up in bed, water given and Janne Napoleon, NP at the bedside.

## 2015-03-29 NOTE — ED Notes (Addendum)
Checked on pt.  The swelling and redness has spread half way down her right forearm since I checked her in.  Pt still denies problems with breathing or swallowing, but she is in pain.  Janne Napoleon, NP notified.

## 2015-04-12 ENCOUNTER — Encounter (HOSPITAL_BASED_OUTPATIENT_CLINIC_OR_DEPARTMENT_OTHER): Payer: Self-pay | Admitting: *Deleted

## 2015-08-10 ENCOUNTER — Encounter (HOSPITAL_BASED_OUTPATIENT_CLINIC_OR_DEPARTMENT_OTHER): Payer: Self-pay | Admitting: *Deleted

## 2015-08-11 ENCOUNTER — Encounter (HOSPITAL_BASED_OUTPATIENT_CLINIC_OR_DEPARTMENT_OTHER)
Admission: RE | Admit: 2015-08-11 | Discharge: 2015-08-11 | Disposition: A | Discharge status: Home / Self Care | Payer: Managed Care, Other (non HMO) | Source: Ambulatory Visit | Attending: Orthopedic Surgery | Admitting: Orthopedic Surgery

## 2015-08-11 ENCOUNTER — Other Ambulatory Visit: Payer: Self-pay | Admitting: Orthopedic Surgery

## 2015-08-11 DIAGNOSIS — Z01818 Encounter for other preprocedural examination: Secondary | ICD-10-CM | POA: Diagnosis present

## 2015-08-11 LAB — BASIC METABOLIC PANEL
Anion gap: 8 (ref 5–15)
BUN: 19 mg/dL (ref 6–20)
CO2: 27 mmol/L (ref 22–32)
Calcium: 9.8 mg/dL (ref 8.9–10.3)
Chloride: 104 mmol/L (ref 101–111)
Creatinine, Ser: 0.74 mg/dL (ref 0.44–1.00)
GFR calc Af Amer: >60 mL/min (ref >60)
GFR calc non Af Amer: >60 mL/min (ref >60)
GLUCOSE: 90 mg/dL (ref 65–99)
POTASSIUM: 4.4 mmol/L (ref 3.5–5.1)
SODIUM: 139 mmol/L (ref 135–145)

## 2015-08-16 ENCOUNTER — Ambulatory Visit (HOSPITAL_BASED_OUTPATIENT_CLINIC_OR_DEPARTMENT_OTHER): Payer: Managed Care, Other (non HMO) | Admitting: Anesthesiology

## 2015-08-16 ENCOUNTER — Encounter (HOSPITAL_BASED_OUTPATIENT_CLINIC_OR_DEPARTMENT_OTHER): Payer: Self-pay | Admitting: *Deleted

## 2015-08-16 ENCOUNTER — Ambulatory Visit (HOSPITAL_BASED_OUTPATIENT_CLINIC_OR_DEPARTMENT_OTHER)
Admission: RE | Admit: 2015-08-16 | Discharge: 2015-08-16 | Disposition: A | Discharge status: Home / Self Care | Payer: Managed Care, Other (non HMO) | Source: Ambulatory Visit | Attending: Orthopedic Surgery | Admitting: Orthopedic Surgery

## 2015-08-16 ENCOUNTER — Encounter (HOSPITAL_BASED_OUTPATIENT_CLINIC_OR_DEPARTMENT_OTHER)
Admission: RE | Disposition: A | Discharge status: Home / Self Care | Payer: Self-pay | Source: Ambulatory Visit | Attending: Orthopedic Surgery

## 2015-08-16 DIAGNOSIS — D1611 Benign neoplasm of short bones of right upper limb: Secondary | ICD-10-CM | POA: Insufficient documentation

## 2015-08-16 DIAGNOSIS — Z833 Family history of diabetes mellitus: Secondary | ICD-10-CM | POA: Diagnosis not present

## 2015-08-16 DIAGNOSIS — Z79899 Other long term (current) drug therapy: Secondary | ICD-10-CM | POA: Diagnosis not present

## 2015-08-16 DIAGNOSIS — M152 Bouchard's nodes (with arthropathy): Secondary | ICD-10-CM | POA: Insufficient documentation

## 2015-08-16 DIAGNOSIS — Z8261 Family history of arthritis: Secondary | ICD-10-CM | POA: Insufficient documentation

## 2015-08-16 DIAGNOSIS — M151 Heberden's nodes (with arthropathy): Secondary | ICD-10-CM | POA: Diagnosis not present

## 2015-08-16 DIAGNOSIS — I10 Essential (primary) hypertension: Secondary | ICD-10-CM | POA: Insufficient documentation

## 2015-08-16 HISTORY — PX: MASS EXCISION: SHX2000

## 2015-08-16 SURGERY — EXCISION MASS
Anesthesia: Monitor Anesthesia Care | Site: Hand | Laterality: Right

## 2015-08-16 MED ORDER — PROPOFOL 500 MG/50ML IV EMUL
INTRAVENOUS | Status: AC
  Filled 2015-08-16: qty 50

## 2015-08-16 MED ORDER — GLYCOPYRROLATE 0.2 MG/ML IJ SOLN: 0.2000 mg | Freq: Once | INTRAMUSCULAR | Status: DC | PRN

## 2015-08-16 MED ORDER — MIDAZOLAM HCL 2 MG/2ML IJ SOLN
INTRAMUSCULAR | Status: AC
  Filled 2015-08-16: qty 2

## 2015-08-16 MED ORDER — CEFAZOLIN SODIUM-DEXTROSE 2-3 GM-% IV SOLR
INTRAVENOUS | Status: AC
  Filled 2015-08-16: qty 50

## 2015-08-16 MED ORDER — CHLORHEXIDINE GLUCONATE 4 % EX LIQD: 60.0000 mL | Freq: Once | CUTANEOUS | Status: DC

## 2015-08-16 MED ORDER — OXYCODONE HCL 5 MG/5ML PO SOLN: 5.0000 mg | Freq: Once | ORAL | Status: DC | PRN

## 2015-08-16 MED ORDER — CEFAZOLIN SODIUM-DEXTROSE 2-3 GM-% IV SOLR: 2.0000 g | INTRAVENOUS | Status: DC

## 2015-08-16 MED ORDER — HYDROCODONE-ACETAMINOPHEN 5-325 MG PO TABS: 1.0000 | ORAL_TABLET | Freq: Four times a day (QID) | ORAL | Status: DC | PRN

## 2015-08-16 MED ORDER — PROPOFOL 500 MG/50ML IV EMUL
INTRAVENOUS | Status: DC | PRN
  Administered 2015-08-16: 75 ug/kg/min via INTRAVENOUS

## 2015-08-16 MED ORDER — BUPIVACAINE HCL (PF) 0.25 % IJ SOLN
INTRAMUSCULAR | Status: AC
  Filled 2015-08-16: qty 120

## 2015-08-16 MED ORDER — ACETAMINOPHEN 325 MG PO TABS: 325.0000 mg | ORAL_TABLET | ORAL | Status: DC | PRN

## 2015-08-16 MED ORDER — CHLORHEXIDINE GLUCONATE 4 % EX LIQD
60.0000 mL | Freq: Once | CUTANEOUS | Status: DC
Start: 2015-08-16 — End: 2015-08-16

## 2015-08-16 MED ORDER — MIDAZOLAM HCL 2 MG/2ML IJ SOLN
1.0000 mg | INTRAMUSCULAR | Status: DC | PRN
  Administered 2015-08-16 (×2): 1 mg via INTRAVENOUS

## 2015-08-16 MED ORDER — ACETAMINOPHEN 160 MG/5ML PO SOLN: 325.0000 mg | ORAL | Status: DC | PRN

## 2015-08-16 MED ORDER — ONDANSETRON HCL 4 MG/2ML IJ SOLN
INTRAMUSCULAR | Status: DC | PRN
  Administered 2015-08-16: 50 mg via INTRAVENOUS
  Administered 2015-08-16: 4 mg via INTRAVENOUS

## 2015-08-16 MED ORDER — FENTANYL CITRATE (PF) 100 MCG/2ML IJ SOLN
50.0000 ug | INTRAMUSCULAR | Status: AC | PRN
  Administered 2015-08-16: 50 ug via INTRAVENOUS
  Administered 2015-08-16 (×2): 25 ug via INTRAVENOUS

## 2015-08-16 MED ORDER — BACITRACIN ZINC 500 UNIT/GM EX OINT
TOPICAL_OINTMENT | CUTANEOUS | Status: AC
  Filled 2015-08-16: qty 4.5

## 2015-08-16 MED ORDER — CEFAZOLIN SODIUM-DEXTROSE 2-3 GM-% IV SOLR
2.0000 g | INTRAVENOUS | Status: AC
  Administered 2015-08-16: 2 g via INTRAVENOUS

## 2015-08-16 MED ORDER — FENTANYL CITRATE (PF) 100 MCG/2ML IJ SOLN: 25.0000 ug | INTRAMUSCULAR | Status: DC | PRN

## 2015-08-16 MED ORDER — ONDANSETRON HCL 4 MG/2ML IJ SOLN
INTRAMUSCULAR | Status: AC
  Filled 2015-08-16: qty 2

## 2015-08-16 MED ORDER — SCOPOLAMINE 1 MG/3DAYS TD PT72: 1.0000 | MEDICATED_PATCH | Freq: Once | TRANSDERMAL | Status: DC | PRN

## 2015-08-16 MED ORDER — FENTANYL CITRATE (PF) 100 MCG/2ML IJ SOLN
INTRAMUSCULAR | Status: AC
  Filled 2015-08-16: qty 2

## 2015-08-16 MED ORDER — LACTATED RINGERS IV SOLN
INTRAVENOUS | Status: DC
  Administered 2015-08-16: 08:00:00 via INTRAVENOUS

## 2015-08-16 MED ORDER — SUCCINYLCHOLINE CHLORIDE 20 MG/ML IJ SOLN
INTRAMUSCULAR | Status: AC
  Filled 2015-08-16: qty 1

## 2015-08-16 MED ORDER — BUPIVACAINE HCL (PF) 0.25 % IJ SOLN
INTRAMUSCULAR | Status: DC | PRN
  Administered 2015-08-16: 8 mL

## 2015-08-16 MED ORDER — OXYCODONE HCL 5 MG PO TABS: 5.0000 mg | ORAL_TABLET | Freq: Once | ORAL | Status: DC | PRN

## 2015-08-16 SURGICAL SUPPLY — 44 items
BANDAGE COBAN STERILE 2 (GAUZE/BANDAGES/DRESSINGS) IMPLANT
BLADE SURG 15 STRL LF DISP TIS (BLADE) ×1 IMPLANT
BLADE SURG 15 STRL SS (BLADE) ×2
BNDG COHESIVE 1X5 TAN STRL LF (GAUZE/BANDAGES/DRESSINGS) ×3 IMPLANT
BNDG COHESIVE 3X5 TAN STRL LF (GAUZE/BANDAGES/DRESSINGS) IMPLANT
BNDG ESMARK 4X9 LF (GAUZE/BANDAGES/DRESSINGS) IMPLANT
BNDG GAUZE ELAST 4 BULKY (GAUZE/BANDAGES/DRESSINGS) IMPLANT
CHLORAPREP W/TINT 26ML (MISCELLANEOUS) ×3 IMPLANT
CORDS BIPOLAR (ELECTRODE) ×3 IMPLANT
COVER BACK TABLE 60X90IN (DRAPES) ×3 IMPLANT
COVER MAYO STAND STRL (DRAPES) ×3 IMPLANT
CUFF TOURNIQUET SINGLE 18IN (TOURNIQUET CUFF) ×3 IMPLANT
DECANTER SPIKE VIAL GLASS SM (MISCELLANEOUS) IMPLANT
DRAIN PENROSE 1/2X12 LTX STRL (WOUND CARE) IMPLANT
DRAPE EXTREMITY T 121X128X90 (DRAPE) ×3 IMPLANT
DRAPE SURG 17X23 STRL (DRAPES) ×3 IMPLANT
GAUZE SPONGE 4X4 12PLY STRL (GAUZE/BANDAGES/DRESSINGS) ×3 IMPLANT
GAUZE XEROFORM 1X8 LF (GAUZE/BANDAGES/DRESSINGS) ×3 IMPLANT
GLOVE BIOGEL PI IND STRL 7.0 (GLOVE) ×2 IMPLANT
GLOVE BIOGEL PI IND STRL 8.5 (GLOVE) ×1 IMPLANT
GLOVE BIOGEL PI INDICATOR 7.0 (GLOVE) ×4
GLOVE BIOGEL PI INDICATOR 8.5 (GLOVE) ×2
GLOVE ECLIPSE 6.5 STRL STRAW (GLOVE) ×3 IMPLANT
GLOVE SURG ORTHO 8.0 STRL STRW (GLOVE) ×3 IMPLANT
GOWN STRL REUS W/ TWL LRG LVL3 (GOWN DISPOSABLE) ×1 IMPLANT
GOWN STRL REUS W/TWL LRG LVL3 (GOWN DISPOSABLE) ×2
GOWN STRL REUS W/TWL XL LVL3 (GOWN DISPOSABLE) ×3 IMPLANT
NDL SAFETY ECLIPSE 18X1.5 (NEEDLE) IMPLANT
NEEDLE HYPO 18GX1.5 SHARP (NEEDLE)
NEEDLE PRECISIONGLIDE 27X1.5 (NEEDLE) ×3 IMPLANT
NS IRRIG 1000ML POUR BTL (IV SOLUTION) ×3 IMPLANT
PACK BASIN DAY SURGERY FS (CUSTOM PROCEDURE TRAY) ×3 IMPLANT
PAD CAST 3X4 CTTN HI CHSV (CAST SUPPLIES) IMPLANT
PADDING CAST COTTON 3X4 STRL (CAST SUPPLIES)
SPLINT FINGER 5.25 BULB (SOFTGOODS) ×3 IMPLANT
SPLINT PLASTER CAST XFAST 3X15 (CAST SUPPLIES) IMPLANT
SPLINT PLASTER XTRA FASTSET 3X (CAST SUPPLIES)
STOCKINETTE 4X48 STRL (DRAPES) ×3 IMPLANT
SUT ETHILON 4 0 PS 2 18 (SUTURE) ×3 IMPLANT
SUT VIC AB 4-0 P2 18 (SUTURE) IMPLANT
SYR BULB 3OZ (MISCELLANEOUS) ×3 IMPLANT
SYR CONTROL 10ML LL (SYRINGE) ×3 IMPLANT
TOWEL OR 17X24 6PK STRL BLUE (TOWEL DISPOSABLE) ×3 IMPLANT
UNDERPAD 30X30 (UNDERPADS AND DIAPERS) ×3 IMPLANT

## 2015-08-16 NOTE — Brief Op Note (Signed)
08/16/2015  9:33 AM  PATIENT:  Kristen Mann  55 y.o. 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 FINGER  PROCEDURE:  Procedure(s) with comments: EXCISION MASS RIGHT INDEX DEBRIDEMENT PROXIMAL AND DISTAL INTERPHALANGEAL JOINT (Right) - REG/FAB  SURGEON:  Surgeon(s) and Role:    * Daryll Brod, MD - Primary  PHYSICIAN ASSISTANT:   ASSISTANTS: none   ANESTHESIA:   local and regional  EBL:  Total I/O In: 1000 [I.V.:1000] Out: -   BLOOD ADMINISTERED:none  DRAINS: none   LOCAL MEDICATIONS USED:  MARCAINE     SPECIMEN:  Excision  DISPOSITION OF SPECIMEN:  PATHOLOGY  COUNTS:  YES  TOURNIQUET:   Total Tourniquet Time Documented: Forearm (Right) - 39 minutes Total: Forearm (Right) - 39 minutes   DICTATION: .Other Dictation: Dictation Number [id number]  PLAN OF CARE: Discharge to home after PACU  PATIENT DISPOSITION:  PACU - hemodynamically stable.

## 2015-08-16 NOTE — Anesthesia Postprocedure Evaluation (Signed)
Anesthesia Post Note  Patient: Kristen Mann  Procedure(s) Performed: Procedure(s) (LRB): EXCISION MASS RIGHT INDEX DEBRIDEMENT PROXIMAL AND DISTAL INTERPHALANGEAL JOINT (Right)  Patient location during evaluation: PACU Anesthesia Type: MAC and Bier Block Level of consciousness: awake Pain management: pain level controlled Vital Signs Assessment: post-procedure vital signs reviewed and stable Respiratory status: spontaneous breathing Cardiovascular status: stable Postop Assessment: no signs of nausea or vomiting Anesthetic complications: no    Last Vitals:  Filed Vitals:   08/16/15 1007 08/16/15 1034  BP:  153/81  Pulse: 63 82  Temp:  36.5 C  Resp: 14 16    Last Pain:  Filed Vitals:   08/16/15 1034  PainSc: 0-No pain                 Karlye Ihrig

## 2015-08-16 NOTE — Discharge Instructions (Addendum)

## 2015-08-16 NOTE — Anesthesia Preprocedure Evaluation (Signed)
Anesthesia Evaluation  Patient identified by MRN, date of birth, ID band Patient awake    Reviewed: Allergy & Precautions, NPO status , Patient's Chart, lab work & pertinent test results, reviewed documented beta blocker date and time   History of Anesthesia Complications Negative for: history of anesthetic complications  Airway Mallampati: II  TM Distance: >3 FB Neck ROM: Full    Dental  (+) Teeth Intact   Pulmonary neg pulmonary ROS,    breath sounds clear to auscultation       Cardiovascular hypertension, Pt. on medications and Pt. on home beta blockers  Rhythm:Regular     Neuro/Psych negative neurological ROS  negative psych ROS   GI/Hepatic negative GI ROS, Neg liver ROS,   Endo/Other  negative endocrine ROS  Renal/GU negative Renal ROS     Musculoskeletal negative musculoskeletal ROS (+)   Abdominal   Peds  Hematology negative hematology ROS (+)   Anesthesia Other Findings   Reproductive/Obstetrics                             Anesthesia Physical Anesthesia Plan  ASA: II  Anesthesia Plan: MAC and Bier Block   Post-op Pain Management:    Induction:   Airway Management Planned: Natural Airway, Nasal Cannula and Simple Face Mask  Additional Equipment: None  Intra-op Plan:   Post-operative Plan:   Informed Consent: I have reviewed the patients History and Physical, chart, labs and discussed the procedure including the risks, benefits and alternatives for the proposed anesthesia with the patient or authorized representative who has indicated his/her understanding and acceptance.   Dental advisory given  Plan Discussed with: CRNA and Surgeon  Anesthesia Plan Comments:         Anesthesia Quick Evaluation

## 2015-08-16 NOTE — Op Note (Signed)
NAME:  Kristen Mann, Kristen Mann                    ACCOUNT NO.:  MEDICAL RECORD NO.:  [id number]  LOCATION:                                 FACILITY:  PHYSICIAN:  Daryll Brod, M.D.       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  OPERATION:  Excision of the mucoid cyst, debridement of proximal interphalangeal joint, right index finger.  Excision of mucoid cyst, debridement of distal interphalangeal joint, right index finger.  SURGEON:  Daryll Brod, MD  ANESTHESIA:  Forearm IV regional with metacarpal block.  ANESTHESIOLOGIST:  Moser.  HISTORY:  The patient is a 55 year old female with a history of a mass over the PIP joint of her right index finger and the DIP joint of her right index finger.  The DIP has been excised in the past elsewhere. She is admitted for excision of the PIP joint cyst with debridement of the joint.  In the preoperative area, she requested the DIP joint to be operated on also.  She is aware of risks and complications including infection; recurrence of injury to arteries, nerves, tendons; incomplete relief of symptoms; dystrophy.  In the preoperative area, the patient was seen, the extremity marked by both patient and surgeon.  Antibiotic given.  PROCEDURE IN DETAIL:  The patient was brought to the operating room, where a forearm-based IV regional anesthetic was carried out without difficulty.  She was prepped using ChloraPrep, supine position with the right arm free.  A 3-minute dry time was allowed.  Time-out taken, confirming patient and procedure.  A  metacarpal block was given with 0.25% bupivacaine without epinephrine, 8 mL was used.  A curvilinear incision was made over the PIP joint of the right index finger carried down through subcutaneous tissue.  Bleeders were electrocauterized with bipolar.  Dorsal sensory nerve identified.  A cystic lesion was present within the extensor tendon of the right index finger on the ulnar aspect.  With blunt and sharp dissection, this was dissected free, followed down into the joint, a large osteophyte was present on the proximal phalanx distal margin, this was removed with a rongeur and a dorsal synovectomy performed with a hemostatic-type rongeur.  The specimen was sent to Pathology.  The wound was copiously irrigated with saline.  The extensor tendon was repaired with a running 5-0 Vicryl suture.  The skin was then closed with interrupted 4-0 nylon sutures.  A separate incision was then made curvilinear over the distal interphalangeal joint, in line with the old incision on the lateral aspect carried down through subcutaneous tissue.  A cyst was immediately encountered.  This was resected.  The joint opened and debridement of the distal interphalangeal joint was then performed with a rongeur and House curette.  The wound was again irrigated.  The skin was then closed with interrupted 4-0 nylon sutures.  A sterile compressive dressing to the finger with an Alumafoam dorsal splint applied.  The patient tolerated the procedure well.  On deflation of the tourniquet, remaining fingers pinked, she was taken to the recovery room for observation in satisfactory condition. She will be discharged home to return to the [facility] in 1 week on Norco.          ______________________________ Daryll Brod, M.D.     GK/MEDQ  D:  08/16/2015  T:  08/16/2015  Job:  [id number]

## 2015-08-16 NOTE — Transfer of Care (Signed)
Immediate Anesthesia Transfer of Care Note  Patient: Kristen Mann  Procedure(s) Performed: Procedure(s) with comments: EXCISION MASS RIGHT INDEX DEBRIDEMENT PROXIMAL AND DISTAL INTERPHALANGEAL JOINT (Right) - REG/FAB  Patient Location: PACU  Anesthesia Type:Bier block  Level of Consciousness: awake, alert  and patient cooperative  Airway & Oxygen Therapy: Patient Spontanous Breathing and Patient connected to face mask oxygen  Post-op Assessment: Report given to RN and Post -op Vital signs reviewed and stable  Post vital signs: Reviewed and stable  Last Vitals:  Filed Vitals:   08/16/15 0722  BP: 123/74  Pulse: 66  Temp: 36.8 C  Resp: 18    Complications: No apparent anesthesia complications

## 2015-08-16 NOTE — H&P (Signed)
  Kristen Mann is an 55 y.o. female.   Chief Complaint: Everest is 55 yo female with a mass on index finger right hand PIP joint. She has had a prior removal of a mass from the DIP. She has no history of injury. There is a family history of DM and arthritis. She is complaining of the distal interphalangeal joint  Arthritis and mass also. HPI: see above  Past Medical History  Diagnosis Date  . Anemia   . Headache(784.0)     stress related  . Hypertension     bystolic    Past Surgical History  Procedure Laterality Date  . Appendectomy      2003  . Nose surgery    . Endometrial ablation      History reviewed. No pertinent family history. Social History:  reports that she has never smoked. She does not have any smokeless tobacco history on file. She reports that she drinks alcohol. She reports that she does not use illicit drugs.  Allergies:  Allergies  Allergen Reactions  . Codeine     Patient states that the medication makes her act crazy.  Very emotional.  . Sulfa Antibiotics Itching    Medications Prior to Admission  Medication Sig Dispense Refill  . cetirizine (ZYRTEC) 10 MG tablet Take 10 mg by mouth daily.      . Cinnamon 500 MG capsule Take 500 mg by mouth daily.    Marland Kitchen FLUoxetine (PROZAC) 20 MG tablet Take 20 mg by mouth daily.    . nebivolol (BYSTOLIC) 5 MG tablet Take 5 mg by mouth daily.      No results found for this or any previous visit (from the past 48 hour(s)).  No results found.   Eyes: positive for glasses  Hearing loss CV: HBP headaches  Blood pressure 123/74, pulse 66, temperature 98.2 F (36.8 C), temperature source Oral, resp. rate 18, height 5\' 8"  (1.727 m), weight 74.957 kg (165 lb 4 oz), SpO2 100 %.  General appearance: alert, cooperative and appears stated age Head: Normocephalic, without obvious abnormality Neck: no JVD Resp: clear to auscultation bilaterally Cardio: regular rate and rhythm, S1, S2 normal, no murmur, click, rub or  gallop GI: soft, non-tender; bowel sounds normal; no masses,  no organomegaly Extremities: paina nd swelling PIP and DIP joints right index finger Pulses: 2+ and symmetric Skin: Skin color, texture, turgor normal. 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  The patient is aware there is no guarantee with the surgery, possibility of infection, recurrence, injury to arteries, nerves, tendons, incomplete relief of symptoms, stiffness, loss of mobility and dystrophy.    Chanoch Mccleery R 08/16/2015, 7:35 AM

## 2015-08-16 NOTE — Op Note (Signed)
Dictation Number [id number]

## 2015-08-17 ENCOUNTER — Encounter (HOSPITAL_BASED_OUTPATIENT_CLINIC_OR_DEPARTMENT_OTHER): Payer: Self-pay | Admitting: Orthopedic Surgery

## 2017-01-03 ENCOUNTER — Telehealth: Payer: Self-pay | Admitting: Cardiovascular Disease

## 2017-01-03 NOTE — Telephone Encounter (Signed)
Received records from [facility] for appointment on 01/23/17 with Dr Gwenlyn Found.  Records put with Dr Kennon Holter schedule for 01/23/17. lp

## 2017-01-07 ENCOUNTER — Ambulatory Visit (INDEPENDENT_AMBULATORY_CARE_PROVIDER_SITE_OTHER): Payer: Managed Care, Other (non HMO) | Admitting: Orthopaedic Surgery

## 2017-01-07 ENCOUNTER — Ambulatory Visit (INDEPENDENT_AMBULATORY_CARE_PROVIDER_SITE_OTHER): Payer: Managed Care, Other (non HMO)

## 2017-01-07 DIAGNOSIS — M25571 Pain in right ankle and joints of right foot: Secondary | ICD-10-CM | POA: Diagnosis not present

## 2017-01-07 MED ORDER — METHYLPREDNISOLONE 4 MG PO TABS: ORAL_TABLET | ORAL | 0 refills | Status: DC

## 2017-01-07 NOTE — Progress Notes (Signed)
Office Visit Note   Patient: Kristen Mann           Date of Birth: 11-13-1960           MRN: [id number] Visit Date: 01/07/2017              Requested by: No referring provider defined for this encounter. PCP: Burnard Bunting, MD   Assessment & Plan: Visit Diagnoses:  1. Pain in right ankle and joints of right foot     Plan: I will treat this for now as a stress response on her foot. I will put her in a postop shoe and have her only perform exercises at take the load off the foot such as her, bike or swimming. I like see her back in about 2 weeks for repeat exam and see how she responded to this from a pain standpoint. If she is not getting significantly better we may see about an MRI. I will try steroid taper and Aleve as well.  Follow-Up Instructions: Return in about 2 weeks (around 01/21/2017).   Orders:  Orders Placed This Encounter  Procedures  . XR Foot Complete Right   Meds ordered this encounter  Medications  . methylPREDNISolone (MEDROL) 4 MG tablet    Sig: Medrol dose pack. Take as instructed    Dispense:  21 tablet    Refill:  0      Procedures: No procedures performed   Clinical Data: No additional findings.   Subjective: No chief complaint on file. The patient comes in with a one-month history of worsening left foot pain. She points the lateral aspect of the foot as a source for pain. She denies any injury that she is aware of. She does walk and exercise regularly. She is not a diabetic.  HPI  Review of Systems  She denies any headache, chest pain, shortness of breath, fever, chills, nausea, vomiting  Objective: Vital Signs: There were no vitals taken for this visit.  Physical Exam She is alert and oriented 3 and in no acute distress Ortho Exam Examination of her left foot does show some plantar fibromatosis on the medial arch for but is not painful to her. Of note she does have Dupuytren's contracture bilateral hands and again she is not  diabetic. She does have tenderness over the base of the fifth metatarsal and over the fifth metatarsal head almost as a bunionette but shouldn't have significant deformity on clinical exam otherwise. Specialty Comments:  No specialty comments available.  Imaging: Xr Foot Complete Right  Result Date: 01/07/2017 3 views of the right foot show no acute findings around the lateral ray including the fifth metatarsal. This is what source of pain is but I see no significant findings.    PMFS History: There are no active problems to display for this patient.  Past Medical History:  Diagnosis Date  . Anemia   . Headache(784.0)    stress related  . Hypertension    bystolic    No family history on file.  Past Surgical History:  Procedure Laterality Date  . APPENDECTOMY     2003  . ENDOMETRIAL ABLATION    . MASS EXCISION Right 08/16/2015   Procedure: EXCISION MASS RIGHT INDEX DEBRIDEMENT PROXIMAL AND DISTAL INTERPHALANGEAL JOINT;  Surgeon: Daryll Brod, MD;  Location: [facility];  Service: Orthopedics;  Laterality: Right;  REG/FAB  . NOSE SURGERY     Social History   Occupational History  . Not on file.  Social History Main Topics  . Smoking status: Never Smoker  . Smokeless tobacco: Not on file  . Alcohol use Yes     Comment: occasionally  . Drug use: No  . Sexual activity: Yes    Birth control/ protection: Surgical

## 2017-01-18 ENCOUNTER — Ambulatory Visit: Payer: Managed Care, Other (non HMO) | Admitting: Cardiovascular Disease

## 2017-01-21 ENCOUNTER — Ambulatory Visit (INDEPENDENT_AMBULATORY_CARE_PROVIDER_SITE_OTHER): Payer: Managed Care, Other (non HMO) | Admitting: Physician Assistant

## 2017-01-23 ENCOUNTER — Ambulatory Visit (INDEPENDENT_AMBULATORY_CARE_PROVIDER_SITE_OTHER): Payer: Managed Care, Other (non HMO) | Admitting: Cardiovascular Disease

## 2017-01-23 ENCOUNTER — Encounter: Payer: Self-pay | Admitting: Cardiovascular Disease

## 2017-01-23 DIAGNOSIS — R002 Palpitations: Secondary | ICD-10-CM

## 2017-01-23 DIAGNOSIS — I10 Essential (primary) hypertension: Secondary | ICD-10-CM | POA: Diagnosis not present

## 2017-01-23 NOTE — Progress Notes (Signed)
01/23/2017 Kristen Mann   Nov 25, 1960  [id number]  Primary Physician Burnard Bunting, MD Primary Cardiologist: Lorretta Harp MD Lupe Carney, [place]  HPI:  Kristen Mann is a very pleasant 56 year old mildly overweight Caucasian female mother of 2 biologic and 3 stepchildren who works in Production assistant, radio. She was referred by Dr. Reynaldo Minium for cardiovascular evaluation because of palpitations. She does have a history of hypertension. She has no other risk factors. I did evaluate her 5 or 6 years ago for similar problem with 2-D echocardiography and event monitoring both of which were unrevealing. Her symptoms ultimately resolved spontaneously. She's been under a lot of stress recently with work and at home. She was having episodes lasting seconds at a time twice a week up until a month ago when her symptoms began to improve spontaneously.   Current Outpatient Prescriptions  Medication Sig Dispense Refill  . cetirizine (ZYRTEC) 10 MG tablet Take 10 mg by mouth daily.      . nebivolol (BYSTOLIC) 5 MG tablet Take 5 mg by mouth daily.    . tacrolimus (PROTOPIC) 0.03 % ointment   0   No current facility-administered medications for this visit.     Allergies  Allergen Reactions  . Codeine Other (See Comments)    Patient states that the medication makes her act crazy.  Very emotional.  . Sulfa Antibiotics Itching    Social History   Social History  . Marital status: Divorced    Spouse name: N/A  . Number of children: N/A  . Years of education: N/A   Occupational History  . Not on file.   Social History Main Topics  . Smoking status: Never Smoker  . Smokeless tobacco: Never Used  . Alcohol use No     Comment: occasionally  . Drug use: No  . Sexual activity: Yes    Birth control/ protection: Surgical   Other Topics Concern  . Not on file   Social History Narrative  . No narrative on file     Review of Systems: General: negative for chills, fever, night sweats or  weight changes.  Cardiovascular: negative for chest pain, dyspnea on exertion, edema, orthopnea, palpitations, paroxysmal nocturnal dyspnea or shortness of breath Dermatological: negative for rash Respiratory: negative for cough or wheezing Urologic: negative for hematuria Abdominal: negative for nausea, vomiting, diarrhea, bright red blood per rectum, melena, or hematemesis Neurologic: negative for visual changes, syncope, or dizziness All other systems reviewed and are otherwise negative except as noted above.    Blood pressure (!) 142/89, pulse 83, height 5\' 8"  (1.727 m), weight 166 lb 3.2 oz (75.4 kg).  General appearance: alert and no distress Neck: no adenopathy, no carotid bruit, no JVD, supple, symmetrical, trachea midline and thyroid not enlarged, symmetric, no tenderness/mass/nodules Lungs: clear to auscultation bilaterally Heart: regular rate and rhythm, S1, S2 normal, no murmur, click, rub or gallop Extremities: extremities normal, atraumatic, no cyanosis or edema  EKG not performed today  ASSESSMENT AND PLAN:   Essential hypertension History of essential hypertension blood pressure measured at 142/89. He is on low-dose Bystolic . Continue current meds at current dosing.   Palpitations History of palpitations. She was evaluated by myself or 6 years ago with an echo and event monitor all of which were unremarkable. Her symptoms ultimately resolved and she thought they were related stress. She's been under a lot of stress lately has had palpitations occurring twice a week up until a month ago which time he  became much less frequent. She drinks one cup of coffee a day. At this point, I'd rather pursue the course of watchful waiting rather than repeat a workup. I'll see her back in 3 months.      Lorretta Harp MD FACP,FACC,FAHA, [facility] 01/23/2017 2:48 PM

## 2017-01-23 NOTE — Patient Instructions (Signed)
Medication Instructions: Your physician recommends that you continue on your current medications as directed. Please refer to the Current Medication list given to you today.   Follow-Up: Your physician recommends that you schedule a follow-up appointment in: 3 months with Dr. Berry.     

## 2017-01-23 NOTE — Assessment & Plan Note (Signed)
History of essential hypertension blood pressure measured at 142/89. He is on low-dose Bystolic . Continue current meds at current dosing.

## 2017-01-23 NOTE — Assessment & Plan Note (Signed)
History of palpitations. She was evaluated by myself or 6 years ago with an echo and event monitor all of which were unremarkable. Her symptoms ultimately resolved and she thought they were related stress. She's been under a lot of stress lately has had palpitations occurring twice a week up until a month ago which time he became much less frequent. She drinks one cup of coffee a day. At this point, I'd rather pursue the course of watchful waiting rather than repeat a workup. I'll see her back in 3 months.

## 2017-01-31 ENCOUNTER — Ambulatory Visit (INDEPENDENT_AMBULATORY_CARE_PROVIDER_SITE_OTHER): Payer: Managed Care, Other (non HMO) | Admitting: Orthopaedic Surgery

## 2017-04-16 ENCOUNTER — Ambulatory Visit: Payer: Managed Care, Other (non HMO) | Admitting: Cardiovascular Disease

## 2017-09-10 ENCOUNTER — Other Ambulatory Visit: Payer: Self-pay

## 2019-01-14 ENCOUNTER — Other Ambulatory Visit: Payer: Self-pay | Admitting: Internal Medicine

## 2019-01-14 DIAGNOSIS — R070 Pain in throat: Secondary | ICD-10-CM

## 2019-01-23 ENCOUNTER — Other Ambulatory Visit: Payer: Self-pay | Admitting: Internal Medicine

## 2019-01-23 DIAGNOSIS — E049 Nontoxic goiter, unspecified: Secondary | ICD-10-CM

## 2019-01-26 ENCOUNTER — Other Ambulatory Visit: Payer: Self-pay | Admitting: Internal Medicine

## 2019-01-26 DIAGNOSIS — E049 Nontoxic goiter, unspecified: Secondary | ICD-10-CM

## 2019-01-26 DIAGNOSIS — R070 Pain in throat: Secondary | ICD-10-CM

## 2019-01-27 ENCOUNTER — Ambulatory Visit
Admission: RE | Admit: 2019-01-27 | Discharge: 2019-01-27 | Disposition: A | Discharge status: Home / Self Care | Payer: Managed Care, Other (non HMO) | Source: Ambulatory Visit | Attending: Internal Medicine | Admitting: Internal Medicine

## 2019-01-27 ENCOUNTER — Other Ambulatory Visit: Payer: Self-pay

## 2019-01-27 DIAGNOSIS — R070 Pain in throat: Secondary | ICD-10-CM

## 2019-01-27 DIAGNOSIS — E049 Nontoxic goiter, unspecified: Secondary | ICD-10-CM

## 2022-04-04 DIAGNOSIS — Z0000 Encounter for general adult medical examination without abnormal findings: Secondary | ICD-10-CM | POA: Diagnosis not present

## 2022-04-04 DIAGNOSIS — E0590 Thyrotoxicosis, unspecified without thyrotoxic crisis or storm: Secondary | ICD-10-CM | POA: Diagnosis not present

## 2022-04-04 DIAGNOSIS — F419 Anxiety disorder, unspecified: Secondary | ICD-10-CM | POA: Diagnosis not present

## 2022-04-04 DIAGNOSIS — I10 Essential (primary) hypertension: Secondary | ICD-10-CM | POA: Diagnosis not present

## 2022-04-11 DIAGNOSIS — Z1331 Encounter for screening for depression: Secondary | ICD-10-CM | POA: Diagnosis not present

## 2022-04-11 DIAGNOSIS — R82998 Other abnormal findings in urine: Secondary | ICD-10-CM | POA: Diagnosis not present

## 2022-04-11 DIAGNOSIS — I10 Essential (primary) hypertension: Secondary | ICD-10-CM | POA: Diagnosis not present

## 2022-04-11 DIAGNOSIS — Z0000 Encounter for general adult medical examination without abnormal findings: Secondary | ICD-10-CM | POA: Diagnosis not present

## 2022-04-11 DIAGNOSIS — Z1339 Encounter for screening examination for other mental health and behavioral disorders: Secondary | ICD-10-CM | POA: Diagnosis not present

## 2022-05-10 ENCOUNTER — Ambulatory Visit (HOSPITAL_COMMUNITY): Payer: Self-pay | Admitting: Licensed Clinical Social Worker

## 2022-05-28 ENCOUNTER — Ambulatory Visit (INDEPENDENT_AMBULATORY_CARE_PROVIDER_SITE_OTHER): Payer: BC Managed Care – PPO | Admitting: Licensed Clinical Social Worker

## 2022-05-28 DIAGNOSIS — F4323 Adjustment disorder with mixed anxiety and depressed mood: Secondary | ICD-10-CM

## 2022-05-28 DIAGNOSIS — Z636 Dependent relative needing care at home: Secondary | ICD-10-CM

## 2022-05-30 ENCOUNTER — Encounter (HOSPITAL_COMMUNITY): Payer: Self-pay

## 2022-05-30 NOTE — Plan of Care (Signed)
  Problem: Depression Goal:  Decrease depressive symptoms and improve levels of effective functioning-pt reports a decrease in overall depression symptoms 3 out of 5 sessions documented.  Outcome: Initial Goal: Develop healthy thinking patterns and beliefs about self, others, and the world that lead to the alleviation and help prevent the relapse of depression per self report 3 out of 5 sessions documented.   Outcome: Initial   Problem: Anxiety  Goal:  Reduce overall frequency, intensity, and duration of the anxiety so that daily functioning is not impaired per pt self report 3 out of 5 sessions documented.   Outcome: Initial Goal: Learn and implement coping skills that result in a reduction of anxiety and worry, and improve daily functioning per pt report 3 out of 5 sessions documented  Outcome: Initial   Developed/revised tx plan based on pt self reported input. Pt verbally agrees with treatment plan at time of session

## 2022-05-30 NOTE — Progress Notes (Signed)
Virtual Visit via Video Note  I connected with Kristen Mann on 05/28/22 at  1:00 PM EDT by a video enabled telemedicine application and verified that I am speaking with the correct person using two identifiers.  Location: Patient: home Provider: remote office [place], [place])   I discussed the limitations of evaluation and management by telemedicine and the availability of in person appointments. The patient expressed understanding and agreed to proceed.  I discussed the assessment and treatment plan with the patient. The patient was provided an opportunity to ask questions and all were answered. The patient agreed with the plan and demonstrated an understanding of the instructions.   The patient was advised to call back or seek an in-person evaluation if the symptoms worsen or if the condition fails to improve as anticipated.  I provided 55 minutes of non-face-to-face time during this encounter.   [name], LCSW Comprehensive Clinical Assessment (CCA) Note  05/28/2022 [name] [id number]  Chief Complaint:  Chief Complaint  Patient presents with   Establish Care   Visit Diagnosis:  Encounter Diagnoses  Name Primary?   Adjustment disorder with mixed anxiety and depressed mood Yes   Caregiver stress     CCA Screening, Triage and Referral (STR)  Patient Reported Information How did you hear about [place]? Primary Care  Referral name: Dr. Burnard Bunting  Referral phone number: No data recorded  Whom do you see for routine medical problems? Primary Care  Practice/Facility Name: [facility]  Practice/Facility Phone Number: No data recorded Name of Contact: No data recorded Contact Number: No data recorded Contact Fax Number: No data recorded Prescriber Name: Dr. Burnard Bunting  Prescriber Address (if known): No data recorded  What Is the Reason for Your Visit/Call Today? [name] is a 61 yo female reporting to [facility] for  establishment of outpatient psychotherapy services. [name] was referred by her PCP, Dr. Burnard Bunting. Kristen Mann reports that she has had some counseling in the past and has found it helpful. [name] is not currently seeing a psychiatrist and is not taking any psychotropic medication.Pt reports that she took xanax in the past (1999) when she was going through a divorce.  Pt denies any history of any psychiatric inpatient hospitalizations. Pt currently resides with her husband and pet dog. Pt reports husband is good support person. Pt has good relationship w/ youngest son and DIL. Pt reports that she has had some conflict with oldest son. Pt reports that she has 11 grandchildren. Pt denies current SI, HI, or AVH. Pt denies history of self harm behaviors and reports that she only uses ETOH socially. Pt reports that she has some chronic pain--hip and joint pain. Kristen Mann feels that she has a lot of "mom guilt" and family conflict is her primary external stressor currently. Pt would like to develop and strengthen coping skills to help manage depression and anxiety symptoms.  How Long Has This Been Causing You Problems? > than 6 months  What Do You Feel Would Help You the Most Today? Treatment for Depression or other mood problem   Have You Recently Been in Any Inpatient Treatment (Hospital/Detox/Crisis Center/28-Day Program)? No  Name/Location of Program/Hospital:No data recorded How Long Were You There? No data recorded When Were You Discharged? No data recorded  Have You Ever Received Services From [facility] Before? Yes  Who Do You See at [facility]? No data recorded  Have You Recently Had Any Thoughts About Hurting Yourself? No  Are You Planning to  Commit Suicide/Harm Yourself At This time? No   Have you Recently Had Thoughts About [place]? No  Explanation: No data recorded  Have You Used Any Alcohol or Drugs in the Past 24 Hours? No  How Long Ago Did You Use Drugs or Alcohol?  No data recorded What Did You Use and How Much? No data recorded  Do You Currently Have a Therapist/Psychiatrist? No  Name of Therapist/Psychiatrist: No data recorded  Have You Been Recently Discharged From Any Office Practice or Programs? No  Explanation of Discharge From Practice/Program: No data recorded    CCA Screening Triage Referral Assessment Type of Contact: Tele-Assessment  Is this Initial or Reassessment? Initial Assessment  Date Telepsych consult ordered in [facility]:  No data recorded Time Telepsych consult ordered in [facility]:  No data recorded  Patient Reported Information Reviewed? No data recorded Patient Left Without Being Seen? No data recorded Reason for Not Completing Assessment: No data recorded  Collateral Involvement: EPIC review   Does Patient Have a Court Appointed Legal Guardian? No data recorded Name and Contact of Legal Guardian: No data recorded If Minor and Not Living with Parent(s), Who has Custody? No data recorded Is CPS involved or ever been involved? Never  Is APS involved or ever been involved? Never   Patient Determined To Be At Risk for Harm To Self or Others Based on Review of Patient Reported Information or Presenting Complaint? No  Method: No data recorded Availability of Means: No data recorded Intent: No data recorded Notification Required: No data recorded Additional Information for Danger to Others Potential: No data recorded Additional Comments for Danger to Others Potential: No data recorded Are There Guns or Other Weapons in Your Home? No data recorded Types of Guns/Weapons: No data recorded Are These Weapons Safely Secured?                            No data recorded Who Could Verify You Are Able To Have These Secured: No data recorded Do You Have any Outstanding Charges, Pending Court Dates, Parole/Probation? No data recorded Contacted To Inform of Risk of Harm To Self or Others: No data recorded  Location of Assessment: Other  (comment) ([facility] virtual)   Does Patient Present under Involuntary Commitment? No  IVC Papers Initial File Date: No data recorded  [place] of Residence: [place]   Patient Currently Receiving the Following Services: Not Receiving Services   Determination of Need: Routine (7 days)   Options For Referral: Outpatient Therapy; Medication Management (med management PRN)     CCA Biopsychosocial Intake/Chief Complaint:  No data recorded Current Symptoms/Problems: No data recorded  Patient Reported Schizophrenia/Schizoaffective Diagnosis in Past: No data recorded  Strengths: No data recorded Preferences: No data recorded Abilities: No data recorded  Type of Services Patient Feels are Needed: No data recorded  Initial Clinical Notes/Concerns: No data recorded  Mental Health Symptoms Depression:  No data recorded  Duration of Depressive symptoms: No data recorded  Mania:  No data recorded  Anxiety:   No data recorded  Psychosis:  No data recorded  Duration of Psychotic symptoms: No data recorded  Trauma:  No data recorded  Obsessions:  No data recorded  Compulsions:  No data recorded  Inattention:  No data recorded  Hyperactivity/Impulsivity:  No data recorded  Oppositional/Defiant Behaviors:  No data recorded  Emotional Irregularity:  No data recorded  Other Mood/Personality Symptoms:  No data recorded   Mental Status  Exam Appearance and self-care  Stature:  No data recorded  Weight:  No data recorded  Clothing:  No data recorded  Grooming:  No data recorded  Cosmetic use:  No data recorded  Posture/gait:  No data recorded  Motor activity:  No data recorded  Sensorium  Attention:  No data recorded  Concentration:  No data recorded  Orientation:  No data recorded  Recall/memory:  No data recorded  Affect and Mood  Affect:  No data recorded  Mood:  No data recorded  Relating  Eye contact:  No data recorded  Facial expression:  No data recorded  Attitude  toward examiner:  No data recorded  Thought and Language  Speech flow: No data recorded  Thought content:  No data recorded  Preoccupation:  No data recorded  Hallucinations:  No data recorded  Organization:  No data recorded  [employer] of Knowledge:  No data recorded  Intelligence:  No data recorded  Abstraction:  No data recorded  Judgement:  No data recorded  Reality Testing:  No data recorded  Insight:  No data recorded  Decision Making:  No data recorded  Social Functioning  Social Maturity:  No data recorded  Social Judgement:  No data recorded  Stress  Stressors:  No data recorded  Coping Ability:  No data recorded  Skill Deficits:  No data recorded  Supports:  No data recorded    Religion:    Leisure/Recreation:    Exercise/Diet:     CCA Employment/Education Employment/Work Situation:    Education:     CCA Family/Childhood History Family and Relationship History:    Childhood History:     Child/Adolescent Assessment:     CCA Substance Use Alcohol/Drug Use: Alcohol / Drug Use Pain Medications: SEE MAR Prescriptions: SEE MAR Over the Counter: SEE MAR History of alcohol / drug use?: Yes Longest period of sobriety (when/how long): N/A Negative Consequences of Use:  (NONE) Withdrawal Symptoms: None Substance #1 Name of Substance 1: ETOH 1 - Frequency: SOCIALLY 1 - Method of Aquiring: LEGAL 1- Route of Use: ORAL DRINK  ASAM's:  Six Dimensions of Multidimensional Assessment  Dimension 1:  Acute Intoxication and/or Withdrawal Potential:   Dimension 1:  Description of individual's past and current experiences of substance use and withdrawal: NONE  Dimension 2:  Biomedical Conditions and Complications:      Dimension 3:  Emotional, Behavioral, or Cognitive Conditions and Complications:     Dimension 4:  Readiness to Change:     Dimension 5:  Relapse, Continued use, or Continued Problem Potential:     Dimension 6:   Recovery/Living Environment:     ASAM Severity Score: ASAM's Severity Rating Score: 0  ASAM Recommended Level of Treatment: ASAM Recommended Level of Treatment: Level I Outpatient Treatment   Substance use Disorder (SUD) Substance Use Disorder (SUD)  Checklist Symptoms of Substance Use:  (NONE)  Recommendations for Services/Supports/Treatments: Recommendations for Services/Supports/Treatments Recommendations For Services/Supports/Treatments: Individual Therapy, Medication Management (MEDICATION MANAGEMENT PRN)  DSM5 Diagnoses: Patient Active Problem List   Diagnosis Date Noted   Palpitations 01/23/2017   Essential hypertension 01/23/2017    Patient Centered Plan: Patient is on the following Treatment Plan(s):  Anxiety and Depression   Referrals to Alternative Service(s): Referred to Alternative Service(s):   Place:   Date:   Time:    Referred to Alternative Service(s):   Place:   Date:   Time:    Referred to Alternative Service(s):   Place:  Date:   Time:    Referred to Alternative Service(s):   Place:   Date:   Time:      Collaboration of Care: Other n/a at time of session--follow up w/ PCP  Patient/Guardian was advised Release of Information must be obtained prior to any record release in order to collaborate their care with an outside provider. Patient/Guardian was advised if they have not already done so to contact the registration department to sign all necessary forms in order for [place] to release information regarding their care.   Consent: Patient/Guardian gives verbal consent for treatment and assignment of benefits for services provided during this visit. Patient/Guardian expressed understanding and agreed to proceed.   Levia Waltermire R Garek Schuneman, LCSW

## 2022-07-09 ENCOUNTER — Ambulatory Visit (INDEPENDENT_AMBULATORY_CARE_PROVIDER_SITE_OTHER): Payer: BC Managed Care – PPO | Admitting: Licensed Clinical Social Worker

## 2022-07-09 DIAGNOSIS — Z91198 Patient's noncompliance with other medical treatment and regimen for other reason: Secondary | ICD-10-CM

## 2022-07-09 NOTE — Progress Notes (Signed)
Pt reports that she lost job unexpectedly and is uninsured--pt wishes to cancel appt today and will reschedule when she is insured.

## 2022-08-09 DIAGNOSIS — H05012 Cellulitis of left orbit: Secondary | ICD-10-CM | POA: Diagnosis not present

## 2022-08-09 DIAGNOSIS — H04302 Unspecified dacryocystitis of left lacrimal passage: Secondary | ICD-10-CM | POA: Diagnosis not present

## 2023-01-02 ENCOUNTER — Ambulatory Visit
Admission: RE | Admit: 2023-01-02 | Discharge: 2023-01-02 | Disposition: A | Discharge status: Home / Self Care | Payer: Commercial Managed Care - PPO | Source: Ambulatory Visit | Attending: Internal Medicine | Admitting: Internal Medicine

## 2023-01-02 VITALS — BP 175/95 | HR 81 | Temp 98.2°F | Resp 18

## 2023-01-02 DIAGNOSIS — J029 Acute pharyngitis, unspecified: Secondary | ICD-10-CM | POA: Insufficient documentation

## 2023-01-02 DIAGNOSIS — J069 Acute upper respiratory infection, unspecified: Secondary | ICD-10-CM | POA: Insufficient documentation

## 2023-01-02 DIAGNOSIS — R051 Acute cough: Secondary | ICD-10-CM | POA: Insufficient documentation

## 2023-01-02 DIAGNOSIS — R590 Localized enlarged lymph nodes: Secondary | ICD-10-CM | POA: Insufficient documentation

## 2023-01-02 LAB — POCT RAPID STREP A (OFFICE): Rapid Strep A Screen: NEGATIVE

## 2023-01-02 MED ORDER — CEFDINIR 300 MG PO CAPS: 300.0000 mg | ORAL_CAPSULE | Freq: Two times a day (BID) | ORAL | 0 refills | Status: AC

## 2023-01-02 NOTE — ED Triage Notes (Signed)
Pt reports she has had a sore throat making it hard to talk and swallow, colorful drainage, right side neck pain, sinus pressure, and bilateral ear pain x 5 days. Took netty pot, flonase pills, flonase nasal spray but no relief.

## 2023-01-02 NOTE — Discharge Instructions (Signed)
Your rapid strep test was negative.  Throat culture is pending.  We will call if it is abnormal.  I have prescribed an antibiotic to treat upper respiratory infection and sore throat.  Please follow-up if any symptoms persist or worsen.

## 2023-01-02 NOTE — ED Provider Notes (Signed)
[facility]    CSN: [id number] Arrival date & time: 01/02/23  0912      History   Chief Complaint Chief Complaint  Patient presents with   Sore Throat    HPI Kristen Mann is a 62 y.o. female.   Patient presents with sore throat, cough, nasal congestion, neck pain that started about 5 to 6 days ago.  Patient reports that her sore throat is very prominent and the pain seems to radiate down the right side of the neck.  Denies any fever or known sick contacts.  Reports that she also has a feeling of bruising in the left lower rib cage but is not sure if this is related to coughing or using a pressure washer yesterday outside.  Denies chest pain, shortness of breath, gastrointestinal symptoms.  Reports that her nasal drainage is yellow in color.  Has taken over-the-counter medications with minimal improvement in symptoms.  Patient's blood pressure is elevated but she is attributing this to using over-the-counter medications and pain.   Sore Throat    Past Medical History:  Diagnosis Date   Anemia    Headache(784.0)    stress related   Hypertension    bystolic   Lightheadedness    Periodic heart flutter    Tachycardia     Patient Active Problem List   Diagnosis Date Noted   Palpitations 01/23/2017   Essential hypertension 01/23/2017    Past Surgical History:  Procedure Laterality Date   APPENDECTOMY     2003   ENDOMETRIAL ABLATION     MASS EXCISION Right 08/16/2015   Procedure: EXCISION MASS RIGHT INDEX DEBRIDEMENT PROXIMAL AND DISTAL INTERPHALANGEAL JOINT;  Surgeon: Cindee Salt, MD;  Location: [facility];  Service: Orthopedics;  Laterality: Right;  REG/FAB   NOSE SURGERY     TONSILLECTOMY      OB History   No obstetric history on file.      Home Medications    Prior to Admission medications   Medication Sig Start Date End Date Taking? Authorizing Provider  budesonide-formoterol [facility]) 160-4.5 MCG/ACT inhaler 2 puffs 10/04/21   Yes [provider]  cefdinir (OMNICEF) 300 MG capsule Take 1 capsule (300 mg total) by mouth 2 (two) times daily for 10 days. 01/02/23 01/12/23 Yes , Acie Fredrickson, FNP  fluticasone (FLONASE) 50 MCG/ACT nasal spray Place into both nostrils daily.   Yes [provider]  rosuvastatin (CRESTOR) 20 MG tablet TAKE 1 TABLET BY MOUTH ONCE A WEEK ON SUNDAY 10/31/21  Yes [provider]  cetirizine (ZYRTEC) 10 MG tablet Take 10 mg by mouth daily.      [provider]  nebivolol (BYSTOLIC) 5 MG tablet Take 5 mg by mouth daily.    [provider]  tacrolimus (PROTOPIC) 0.03 % ointment  12/20/16   [provider]    Family History Family History  Problem Relation Age of Onset   Diabetes Mother    Hypertension Mother    Hypertension Father    Hypercholesterolemia Father    Cancer Father    Aneurysm Sister    Other Sister        2 brain tumors    Social History Social History   Tobacco Use   Smoking status: Never   Smokeless tobacco: Never  Substance Use Topics   Alcohol use: No    Comment: occasionally   Drug use: No     Allergies   Codeine and Sulfa antibiotics   Review of  Systems Review of Systems Per HPI  Physical Exam Triage Vital Signs ED Triage Vitals [01/02/23 0924]  Enc Vitals Group     BP (!) 175/95     Pulse Rate 81     Resp 18     Temp 98.2 F (36.8 C)     Temp Source Oral     SpO2 98 %     Weight      Height      Head Circumference      Peak Flow      Pain Score 7     Pain Loc      Pain Edu?      Excl. in GC?    No data found.  Updated Vital Signs BP (!) 175/95 (BP Location: Left Arm)   Pulse 81   Temp 98.2 F (36.8 C) (Oral)   Resp 18   SpO2 98%   Visual Acuity Right Eye Distance:   Left Eye Distance:   Bilateral Distance:    Right Eye Near:   Left Eye Near:    Bilateral Near:     Physical Exam Constitutional:      General: She is not in acute distress.    Appearance: Normal  appearance. She is not toxic-appearing or diaphoretic.  HENT:     Head: Normocephalic and atraumatic.     Right Ear: Tympanic membrane and ear canal normal.     Left Ear: Tympanic membrane and ear canal normal.     Nose: Congestion present.     Mouth/Throat:     Mouth: Mucous membranes are moist.     Pharynx: Oropharyngeal exudate and posterior oropharyngeal erythema present. No pharyngeal swelling or uvula swelling.     Tonsils: No tonsillar exudate or tonsillar abscesses.  Eyes:     Extraocular Movements: Extraocular movements intact.     Conjunctiva/sclera: Conjunctivae normal.     Pupils: Pupils are equal, round, and reactive to light.  Cardiovascular:     Rate and Rhythm: Normal rate and regular rhythm.     Pulses: Normal pulses.     Heart sounds: Normal heart sounds.  Pulmonary:     Effort: Pulmonary effort is normal. No respiratory distress.     Breath sounds: Normal breath sounds. No stridor. No wheezing, rhonchi or rales.  Chest:     Chest wall: No tenderness.  Abdominal:     General: Abdomen is flat. Bowel sounds are normal.     Palpations: Abdomen is soft.  Musculoskeletal:        General: Normal range of motion.     Cervical back: Normal range of motion.  Lymphadenopathy:     Cervical: Cervical adenopathy present.     Right cervical: Superficial cervical adenopathy present.     Comments: Mild superficial cervical lymph node swelling noted.  Skin:    General: Skin is warm and dry.  Neurological:     General: No focal deficit present.     Mental Status: She is alert and oriented to person, place, and time. Mental status is at baseline.  Psychiatric:        Mood and Affect: Mood normal.        Behavior: Behavior normal.      UC Treatments / Results  Labs (all labs ordered are listed, but only abnormal results are displayed) Labs Reviewed  CULTURE, GROUP A STREP [facility])  POCT RAPID STREP A (OFFICE)    EKG   Radiology No results  found.  Procedures Procedures (including  critical care time)  Medications Ordered in UC Medications - No data to display  Initial Impression / Assessment and Plan / UC Course  I have reviewed the triage vital signs and the nursing notes.  Pertinent labs & imaging results that were available during my care of the patient were reviewed by me and considered in my medical decision making (see chart for details).     Rapid strep test was negative but I am still highly suspicious of strep throat given appearance of posterior pharynx on exam and concern for sinus infection given yellow nasal drainage patient is reporting.  Therefore, will opt to treat with cefdinir antibiotic while awaiting throat culture.  Patient declined COVID testing.  I did discuss with patient the possibility that viral illness could be etiology and supportive care related to this.  Offered patient chest x-ray given patient reporting "bruising discomfort" in the left portion of the ribs but she declined this.  Suspect this is due to muscular strain from recent pressure washing versus musculoskeletal pain from coughing.  No concern for cardiac etiology at this time.  Blood pressure slightly elevated but patient is attributing this to over-the-counter cold medications as well as pain which I do think is reasonable.  No signs of hypertensive urgency on exam so patient was encouraged to monitor blood pressure at home and follow-up with PCP or urgent care if it remains elevated.  Advised strict return precautions.  Patient verbalized understanding and was agreeable with plan. Final Clinical Impressions(s) / UC Diagnoses   Final diagnoses:  Sore throat  Acute upper respiratory infection  Acute cough  Cervical lymphadenopathy     Discharge Instructions      Your rapid strep test was negative.  Throat culture is pending.  We will call if it is abnormal.  I have prescribed an antibiotic to treat upper respiratory infection and  sore throat.  Please follow-up if any symptoms persist or worsen.    ED Prescriptions     Medication Sig Dispense Auth. Provider   cefdinir (OMNICEF) 300 MG capsule Take 1 capsule (300 mg total) by mouth 2 (two) times daily for 10 days. 20 capsule Gustavus Bryant, [place]      PDMP not reviewed this encounter.   Gustavus Bryant, [place] 01/02/23 1002

## 2023-01-03 LAB — CULTURE, GROUP A STREP (THRC)

## 2023-01-04 LAB — CULTURE, GROUP A STREP (THRC)

## 2023-10-07 ENCOUNTER — Other Ambulatory Visit: Payer: Self-pay

## 2023-10-07 ENCOUNTER — Encounter: Payer: Self-pay | Admitting: *Deleted

## 2023-10-07 ENCOUNTER — Ambulatory Visit
Admission: EM | Admit: 2023-10-07 | Discharge: 2023-10-07 | Disposition: A | Discharge status: Home / Self Care | Attending: Family Medicine | Admitting: Family Medicine

## 2023-10-07 DIAGNOSIS — J069 Acute upper respiratory infection, unspecified: Secondary | ICD-10-CM | POA: Diagnosis not present

## 2023-10-07 MED ORDER — PREDNISONE 10 MG PO TABS: 10.0000 mg | ORAL_TABLET | Freq: Two times a day (BID) | ORAL | 0 refills | Status: AC

## 2023-10-07 MED ORDER — PROMETHAZINE-DM 6.25-15 MG/5ML PO SYRP: 5.0000 mL | ORAL_SOLUTION | Freq: Three times a day (TID) | ORAL | 0 refills | Status: AC | PRN

## 2023-10-07 NOTE — ED Triage Notes (Signed)
 Pt c/o "raw throat", cough, congestion, aches since Saturday. No fever

## 2023-10-07 NOTE — ED Provider Notes (Signed)
 [facility]    CSN: [id number] Arrival date & time: 10/07/23  1227      History   Chief Complaint Chief Complaint  Patient presents with   Cough    HPI Kristen Mann is a 63 y.o. female.   Patient presents today with a 3-day history of nasal drainage and congestion, cough, throat irritation from coughing and postnasal drainage.  Patient has not had a fever and denies any generalized bodyaches. No known sick exposures.  Prescribed cetirizine and Flonase for seasonal allergies.  Past Medical History:  Diagnosis Date   Anemia    Headache(784.0)    stress related   Hypertension    bystolic   Lightheadedness    Periodic heart flutter    Tachycardia     Patient Active Problem List   Diagnosis Date Noted   Palpitations 01/23/2017   Essential hypertension 01/23/2017    Past Surgical History:  Procedure Laterality Date   APPENDECTOMY     2003   ENDOMETRIAL ABLATION     MASS EXCISION Right 08/16/2015   Procedure: EXCISION MASS RIGHT INDEX DEBRIDEMENT PROXIMAL AND DISTAL INTERPHALANGEAL JOINT;  Surgeon: Cindee Salt, MD;  Location: [facility];  Service: Orthopedics;  Laterality: Right;  REG/FAB   NOSE SURGERY     TONSILLECTOMY      OB History   No obstetric history on file.      Home Medications    Prior to Admission medications   Medication Sig Start Date End Date Taking? Authorizing Provider  budesonide-formoterol [facility]) 160-4.5 MCG/ACT inhaler 2 puffs 10/04/21  Yes [provider]  cetirizine (ZYRTEC) 10 MG tablet Take 10 mg by mouth daily.     Yes [provider]  fluticasone (FLONASE) 50 MCG/ACT nasal spray Place into both nostrils daily.   Yes [provider]  nebivolol (BYSTOLIC) 5 MG tablet Take 5 mg by mouth daily.   Yes [provider]  predniSONE (DELTASONE) 10 MG tablet Take 1 tablet (10 mg total) by mouth 2 (two) times daily with a meal for 5 days. 10/07/23 10/12/23 Yes Bing Neighbors, NP   promethazine-dextromethorphan (PROMETHAZINE-DM) 6.25-15 MG/5ML syrup Take 5 mLs by mouth 3 (three) times daily as needed for cough. 10/07/23  Yes Bing Neighbors, NP  rosuvastatin (CRESTOR) 20 MG tablet TAKE 1 TABLET BY MOUTH ONCE A WEEK ON SUNDAY 10/31/21  Yes [provider]  tacrolimus (PROTOPIC) 0.03 % ointment  12/20/16   [provider]    Family History Family History  Problem Relation Age of Onset   Diabetes Mother    Hypertension Mother    Hypertension Father    Hypercholesterolemia Father    Cancer Father    Aneurysm Sister    Other Sister        2 brain tumors    Social History Social History   Tobacco Use   Smoking status: Never   Smokeless tobacco: Never  Vaping Use   Vaping status: Never Used  Substance Use Topics   Alcohol use: No    Comment: occasionally   Drug use: No     Allergies   Codeine and Sulfa antibiotics   Review of Systems Review of Systems  Respiratory:  Positive for cough.      Physical Exam Triage Vital Signs ED Triage Vitals  Encounter Vitals Group     BP 10/07/23 1351 (!) 154/86     Systolic BP Percentile --      Diastolic BP Percentile --  Pulse Rate 10/07/23 1351 76     Resp 10/07/23 1351 16     Temp 10/07/23 1351 98.8 F (37.1 C)     Temp Source 10/07/23 1351 Oral     SpO2 10/07/23 1351 98 %     Weight --      Height --      Head Circumference --      Peak Flow --      Pain Score 10/07/23 1349 6     Pain Loc --      Pain Education --      Exclude from Growth Chart --    No data found.  Updated Vital Signs BP (!) 154/86 (BP Location: Left Arm)   Pulse 76   Temp 98.8 F (37.1 C) (Oral)   Resp 16   SpO2 98%   Visual Acuity Right Eye Distance:   Left Eye Distance:   Bilateral Distance:    Right Eye Near:   Left Eye Near:    Bilateral Near:     Physical Exam  General Appearance:    Alert, cooperative, no distress  HENT:   Normocephalic, ears normal, nares mucosal edema with  congestion, rhinorrhea, oropharynx post-nasal drainage present with cervical adenopathy  Eyes:    PERRL, conjunctiva/corneas clear, EOM's intact       Lungs:     Clear to auscultation bilaterally, respirations unlabored  Heart:    Regular rate and rhythm  Neurologic:   Awake, alert, oriented x 3. No apparent focal neurological           defect.      UC Treatments / Results  Labs (all labs ordered are listed, but only abnormal results are displayed) Labs Reviewed - No data to display  EKG   Radiology No results found.  Procedures Procedures (including critical care time)  Medications Ordered in UC Medications - No data to display  Initial Impression / Assessment and Plan / UC Course  I have reviewed the triage vital signs and the nursing notes.  Pertinent labs & imaging results that were available during my care of the patient were reviewed by me and considered in my medical decision making (see chart for details).    Viral URI with cough, most likely viral given duration and onset of symptoms.  Low suspicion for COVID or flu given no fever, generalized bodyaches or any other systemic symptoms.  For discharge medication orders.  Return precautions given if symptoms worsen. Final Clinical Impressions(s) / UC Diagnoses   Final diagnoses:  Viral URI with cough     Discharge Instructions      Your symptoms appear to be viral.  Minimal concern for flu or COVID given no fever or generalized bodyaches.  Take medication as prescribed.  Hydrate well with fluids.  As discussed most viral illnesses are contagious for the first 3 to 4 days however there is a low risk of spread while coughing so wear a mask around any high risk populations elderly, immunocompromised or small children Return as needed.     ED Prescriptions     Medication Sig Dispense Auth. Provider   predniSONE (DELTASONE) 10 MG tablet Take 1 tablet (10 mg total) by mouth 2 (two) times daily with a meal for 5 days.  10 tablet Bing Neighbors, NP   promethazine-dextromethorphan (PROMETHAZINE-DM) 6.25-15 MG/5ML syrup Take 5 mLs by mouth 3 (three) times daily as needed for cough. 180 mL Bing Neighbors, NP      PDMP  not reviewed this encounter.   Bing Neighbors, NP 10/07/23 1431

## 2023-10-07 NOTE — Discharge Instructions (Signed)
 Your symptoms appear to be viral.  Minimal concern for flu or COVID given no fever or generalized bodyaches.  Take medication as prescribed.  Hydrate well with fluids.  As discussed most viral illnesses are contagious for the first 3 to 4 days however there is a low risk of spread while coughing so wear a mask around any high risk populations elderly, immunocompromised or small children Return as needed.

## 2024-03-05 ENCOUNTER — Emergency Department (HOSPITAL_BASED_OUTPATIENT_CLINIC_OR_DEPARTMENT_OTHER)
Admission: EM | Admit: 2024-03-05 | Discharge: 2024-03-05 | Disposition: A | Discharge status: Home / Self Care | Attending: Emergency Medicine | Admitting: Emergency Medicine

## 2024-03-05 ENCOUNTER — Encounter (HOSPITAL_BASED_OUTPATIENT_CLINIC_OR_DEPARTMENT_OTHER): Payer: Self-pay

## 2024-03-05 ENCOUNTER — Emergency Department (HOSPITAL_BASED_OUTPATIENT_CLINIC_OR_DEPARTMENT_OTHER)

## 2024-03-05 ENCOUNTER — Other Ambulatory Visit: Payer: Self-pay

## 2024-03-05 DIAGNOSIS — R519 Headache, unspecified: Secondary | ICD-10-CM

## 2024-03-05 DIAGNOSIS — R0789 Other chest pain: Secondary | ICD-10-CM | POA: Diagnosis present

## 2024-03-05 DIAGNOSIS — H9319 Tinnitus, unspecified ear: Secondary | ICD-10-CM | POA: Insufficient documentation

## 2024-03-05 DIAGNOSIS — Z79899 Other long term (current) drug therapy: Secondary | ICD-10-CM | POA: Diagnosis not present

## 2024-03-05 DIAGNOSIS — I10 Essential (primary) hypertension: Secondary | ICD-10-CM | POA: Insufficient documentation

## 2024-03-05 DIAGNOSIS — I998 Other disorder of circulatory system: Secondary | ICD-10-CM

## 2024-03-05 LAB — BASIC METABOLIC PANEL WITH GFR
Anion gap: 12 (ref 5–15)
BUN: 8 mg/dL (ref 8–23)
CO2: 22 mmol/L (ref 22–32)
Calcium: 9 mg/dL (ref 8.9–10.3)
Chloride: 109 mmol/L (ref 98–111)
Creatinine, Ser: 0.64 mg/dL (ref 0.44–1.00)
GFR, Estimated: >60 mL/min (ref >60)
Glucose, Bld: 88 mg/dL (ref 70–99)
Potassium: 3.9 mmol/L (ref 3.5–5.1)
Sodium: 142 mmol/L (ref 135–145)

## 2024-03-05 LAB — CBC
HCT: 42.1 % (ref 36.0–46.0)
Hemoglobin: 14.3 g/dL (ref 12.0–15.0)
MCH: 30.8 pg (ref 26.0–34.0)
MCHC: 34 g/dL (ref 30.0–36.0)
MCV: 90.5 fL (ref 80.0–100.0)
Platelets: 320 K/uL (ref 150–400)
RBC: 4.65 MIL/uL (ref 3.87–5.11)
RDW: 12.5 % (ref 11.5–15.5)
WBC: 5.4 K/uL (ref 4.0–10.5)
nRBC: 0 % (ref 0.0–0.2)

## 2024-03-05 LAB — TROPONIN T, HIGH SENSITIVITY
Troponin T High Sensitivity: <15 ng/L (ref <19)
Troponin T High Sensitivity: <15 ng/L (ref <19)

## 2024-03-05 LAB — D-DIMER, QUANTITATIVE: D-Dimer, Quant: <0.27 ug{FEU}/mL (ref 0.00–0.50)

## 2024-03-05 LAB — MAGNESIUM: Magnesium: 1.9 mg/dL (ref 1.7–2.4)

## 2024-03-05 MED ORDER — LABETALOL HCL 5 MG/ML IV SOLN
5.0000 mg | Freq: Once | INTRAVENOUS | Status: AC
  Administered 2024-03-05: 5 mg via INTRAVENOUS
  Filled 2024-03-05: qty 4

## 2024-03-05 MED ORDER — HYDRALAZINE HCL 10 MG PO TABS: 10.0000 mg | ORAL_TABLET | Freq: Three times a day (TID) | ORAL | 0 refills | Status: AC | PRN

## 2024-03-05 NOTE — ED Provider Notes (Signed)
 [facility] Provider Note   CSN: [id number] Arrival date & time: 03/05/24  9086     Patient presents with: Chest Pain   DEATRA MCMAHEN is a 63 y.o. female.   Patient is a 64 year old female with past medical history of hypertension presenting for complaints of chest pain.  Patient admits to chest pressure-like sensation that started last week, with rest and exertion, and nonradiating.  She states it was intermittent but is now constant and worsening in severity.  She states over the last 24 hours she has also had associated headache, neck and jaw pain, and tinnitus.  Her blood pressure on arrival is 183/98.  Repeat 10 minutes later was 174/84.  She states she is currently on an antihypertensive that she takes nightly.  Name is unknown.  Unable to find in her medication review.  Denies lower extremity swelling.  The history is provided by the patient. No language interpreter was used.  Chest Pain Associated symptoms: headache   Associated symptoms: no abdominal pain, no back pain, no cough, no fever, no palpitations, no shortness of breath and no vomiting        Prior to Admission medications   Medication Sig Start Date End Date Taking? Authorizing Provider  hydrALAZINE  (APRESOLINE ) 10 MG tablet Take 1 tablet (10 mg total) by mouth every 8 (eight) hours as needed (chest pressure, headache, ringing in ears, or blurred vision with associated blood pressure above 150/90 after home blood pressure meds already taken.). 03/05/24  Yes Elnor Hila P, DO  budesonide-formoterol [facility]) 160-4.5 MCG/ACT inhaler 2 puffs 10/04/21   [provider]  cetirizine (ZYRTEC) 10 MG tablet Take 10 mg by mouth daily.      [provider]  fluticasone (FLONASE) 50 MCG/ACT nasal spray Place into both nostrils daily.    [provider]  nebivolol (BYSTOLIC) 5 MG tablet Take 5 mg by mouth daily.    [provider]   promethazine -dextromethorphan (PROMETHAZINE -DM) 6.25-15 MG/5ML syrup Take 5 mLs by mouth 3 (three) times daily as needed for cough. 10/07/23   Arloa Suzen RAMAN, NP  rosuvastatin (CRESTOR) 20 MG tablet TAKE 1 TABLET BY MOUTH ONCE A WEEK ON SUNDAY 10/31/21   [provider]  tacrolimus (PROTOPIC) 0.03 % ointment  12/20/16   [provider]    Allergies: Codeine and Sulfa antibiotics    Review of Systems  Constitutional:  Negative for chills and fever.  HENT:  Negative for ear pain and sore throat.   Eyes:  Negative for pain and visual disturbance.  Respiratory:  Negative for cough and shortness of breath.   Cardiovascular:  Positive for chest pain. Negative for palpitations.  Gastrointestinal:  Negative for abdominal pain and vomiting.  Genitourinary:  Negative for dysuria and hematuria.  Musculoskeletal:  Negative for arthralgias and back pain.  Skin:  Negative for color change and rash.  Neurological:  Positive for headaches. Negative for seizures and syncope.  All other systems reviewed and are negative.   Updated Vital Signs BP (!) 148/82   Pulse 64   Temp 98.1 F (36.7 C)   Resp 16   Ht 5' 8 (1.727 m)   Wt 69.4 kg   SpO2 100%   BMI 23.26 kg/m   Physical Exam Vitals and nursing note reviewed.  Constitutional:      General: She is not in acute distress.    Appearance: She is well-developed.  HENT:     Head: Normocephalic and  atraumatic.  Eyes:     Conjunctiva/sclera: Conjunctivae normal.  Cardiovascular:     Rate and Rhythm: Normal rate and regular rhythm.     Pulses:          Radial pulses are 2+ on the right side and 2+ on the left side.       Dorsalis pedis pulses are 2+ on the right side and 2+ on the left side.     Heart sounds: No murmur heard. Pulmonary:     Effort: Pulmonary effort is normal. No respiratory distress.     Breath sounds: Normal breath sounds.  Abdominal:     Palpations: Abdomen is soft.     Tenderness: There is no  abdominal tenderness.  Musculoskeletal:        General: No swelling.     Cervical back: Neck supple.  Skin:    General: Skin is warm and dry.     Capillary Refill: Capillary refill takes less than 2 seconds.     Findings: No rash.  Neurological:     Mental Status: She is alert and oriented to person, place, and time.     GCS: GCS eye subscore is 4. GCS verbal subscore is 5. GCS motor subscore is 6.     Cranial Nerves: Cranial nerves 2-12 are intact.     Sensory: Sensation is intact.     Motor: Motor function is intact.  Psychiatric:        Mood and Affect: Mood normal.     (all labs ordered are listed, but only abnormal results are displayed) Labs Reviewed  BASIC METABOLIC PANEL WITH GFR  CBC  MAGNESIUM  D-DIMER, QUANTITATIVE  TROPONIN T, HIGH SENSITIVITY  TROPONIN T, HIGH SENSITIVITY    EKG: EKG Interpretation Date/Time:  Thursday March 05 2024 09:23:43 EDT Ventricular Rate:  70 PR Interval:  144 QRS Duration:  110 QT Interval:  420 QTC Calculation: 457 R Axis:   68  Text Interpretation: Sinus rhythm Borderline repol abnormality, diffuse leads Confirmed by Elnor Hila ([id number]) on 03/05/2024 10:07:12 AM  Radiology: DG Chest 2 View Result Date: 03/05/2024 CLINICAL DATA:  Chest pain. EXAM: CHEST - 2 VIEW COMPARISON:  Chest radiograph dated 07/29/2011. FINDINGS: The heart size and mediastinal contours are within normal limits. Both lungs are clear. The visualized skeletal structures are unremarkable. IMPRESSION: No active cardiopulmonary disease. Electronically Signed   By: Vanetta Chou M.D.   On: 03/05/2024 10:12     Procedures   Medications Ordered in the ED  labetalol  (NORMODYNE ) injection 5 mg (5 mg Intravenous Given 03/05/24 1041)                                    Medical Decision Making Amount and/or Complexity of Data Reviewed Labs: ordered. Radiology: ordered.  Risk Prescription drug management.   63 year old female with past medical history of  hypertension presenting for complaints of chest pain.  Patient is alert and oriented x 3, no acute distress, afebrile, hypertensive but otherwise stable vital signs on arrival.  Blood pressure 183/98.  ECG recorded on March 05, 2024 at 9:23 AM demonstrates sinus rhythm with a rate of 70 bpm.  Borderline repull abnormality diffuse leads.  T wave inversion in lead III only.  No ST segment elevation or depression.  QTc 457.  The patient's chest pain is not suggestive of pulmonary embolus, cardiac ischemia, aortic dissection, pericarditis, myocarditis, pulmonary embolism, pneumothorax, pneumonia,  Zoster, or esophageal perforation, or other serious etiology.  Historically not abrupt in onset, tearing or ripping, pulses symmetric. EKG nonspecific for ischemia/infarction. No dysrhythmias, brugada, WPW, prolonged QT noted. [CXR reviewed and WNL. Troponin negative x2. CXR reviewed. Labs without demonstration of acute pathology unless otherwise noted above.   Patient's symptoms likely secondary to poorly controlled hypertension.  Labetalol  was given and patient's blood pressure came down to 138/77.  She states her headache is improved at this time as well as her blurred vision and chest pressure.  I had a long discussion with the patient regarding blood pressure documentation once daily as well as when his symptoms of chest pressure, headache, tinnitus, or blurred vision arise.  I recommend that she follows closely with her primary care physician in the next 2 weeks for further management of hypertension.  Because her blood pressures have fluctuated greatly here in the department including going from 183/98 down to 144/77 without intervention and 138/77 with intervention I recommend that she keep this log so that they can appropriately adjust her antihypertensives.  As needed hydralazine  has been given for severe symptoms at the house until she can be seen.  Lastly I recommend she sees her primary care physician for  referral to cardiology due to the chest pain symptoms if they are not improved with blood pressure control.  Patient in no distress and overall condition improved here in the ED. Detailed discussions were had with the patient regarding current findings, and need for close f/u with PCP or on call doctor. The patient has been instructed to return immediately if the symptoms worsen in any way for re-evaluation. Patient verbalized understanding and is in agreement with current care plan. All questions answered prior to discharge.        Final diagnoses:  Tinnitus, unspecified laterality  Chest pressure  Nonintractable headache, unspecified chronicity pattern, unspecified headache type  Poorly controlled blood pressure    ED Discharge Orders          Ordered    hydrALAZINE  (APRESOLINE ) 10 MG tablet  Every 8 hours PRN        03/05/24 1239               Elnor Bernarda SQUIBB, DO 03/05/24 1240

## 2024-03-05 NOTE — ED Notes (Signed)
Labs recollected at this time. 

## 2024-03-05 NOTE — ED Triage Notes (Signed)
 Pt states that she is under a lot of stress and has been having some chest pressure  for the last few days. States that she has a roaring in her ears and some shortness of breath.

## 2024-03-05 NOTE — ED Notes (Signed)
 Patient transported to X-ray

## 2024-03-05 NOTE — Discharge Instructions (Addendum)
 And as needed blood pressure medication has been sent to your pharmacy.  You can take this every 8 hours as needed for chest pressure, headache, ringing in ears, or blurred vision with associated blood pressure above 150/90 after home blood pressure meds already taken.  Please call and establish a follow-up appoint with your primary care physician for blood pressure recheck and further management of your blood pressure medications.  If chest pain does not improve after blood pressure management please follow-up with cardiology.
